# Patient Record
Sex: Female | Born: 1935 | Race: White | Hispanic: No | Smoking: Never smoker
Health system: Southern US, Community
[De-identification: ages and names within clinical notes are randomized; demographics above are authoritative.]

## PROBLEM LIST (undated history)

## (undated) DIAGNOSIS — F419 Anxiety disorder, unspecified: Secondary | ICD-10-CM

## (undated) DIAGNOSIS — E039 Hypothyroidism, unspecified: Secondary | ICD-10-CM

## (undated) DIAGNOSIS — N6019 Diffuse cystic mastopathy of unspecified breast: Secondary | ICD-10-CM

## (undated) DIAGNOSIS — K625 Hemorrhage of anus and rectum: Secondary | ICD-10-CM

## (undated) DIAGNOSIS — C50419 Malignant neoplasm of upper-outer quadrant of unspecified female breast: Secondary | ICD-10-CM

## (undated) DIAGNOSIS — F0390 Unspecified dementia without behavioral disturbance: Secondary | ICD-10-CM

## (undated) DIAGNOSIS — D696 Thrombocytopenia, unspecified: Secondary | ICD-10-CM

## (undated) DIAGNOSIS — I10 Essential (primary) hypertension: Secondary | ICD-10-CM

## (undated) DIAGNOSIS — E079 Disorder of thyroid, unspecified: Secondary | ICD-10-CM

## (undated) DIAGNOSIS — E119 Type 2 diabetes mellitus without complications: Secondary | ICD-10-CM

## (undated) DIAGNOSIS — E785 Hyperlipidemia, unspecified: Secondary | ICD-10-CM

## (undated) DIAGNOSIS — K649 Unspecified hemorrhoids: Secondary | ICD-10-CM

## (undated) HISTORY — DX: Unspecified hemorrhoids: K64.9

## (undated) HISTORY — DX: Essential (primary) hypertension: I10

## (undated) HISTORY — DX: Diffuse cystic mastopathy of unspecified breast: N60.19

## (undated) HISTORY — PX: APPENDECTOMY: SHX54

## (undated) HISTORY — PX: THYROID SURGERY: SHX805

## (undated) HISTORY — DX: Hyperlipidemia, unspecified: E78.5

## (undated) HISTORY — DX: Hemorrhage of anus and rectum: K62.5

## (undated) HISTORY — DX: Anxiety disorder, unspecified: F41.9

## (undated) HISTORY — PX: BREAST SURGERY: SHX581

## (undated) HISTORY — DX: Malignant neoplasm of upper-outer quadrant of unspecified female breast: C50.419

## (undated) HISTORY — DX: Disorder of thyroid, unspecified: E07.9

---

## 2003-08-25 DIAGNOSIS — C50419 Malignant neoplasm of upper-outer quadrant of unspecified female breast: Secondary | ICD-10-CM

## 2003-08-25 HISTORY — DX: Malignant neoplasm of upper-outer quadrant of unspecified female breast: C50.419

## 2003-10-16 ENCOUNTER — Other Ambulatory Visit: Payer: Self-pay

## 2004-06-17 ENCOUNTER — Ambulatory Visit: Payer: Self-pay | Admitting: Radiation Oncology

## 2004-07-18 ENCOUNTER — Ambulatory Visit: Payer: Self-pay | Admitting: Radiation Oncology

## 2004-07-28 ENCOUNTER — Other Ambulatory Visit: Payer: Self-pay

## 2004-07-28 ENCOUNTER — Ambulatory Visit: Payer: Self-pay | Admitting: Ophthalmology

## 2004-08-01 ENCOUNTER — Ambulatory Visit: Payer: Self-pay | Admitting: Ophthalmology

## 2004-08-17 ENCOUNTER — Ambulatory Visit: Payer: Self-pay | Admitting: Radiation Oncology

## 2004-08-21 ENCOUNTER — Ambulatory Visit: Payer: Self-pay | Admitting: General Surgery

## 2004-08-24 ENCOUNTER — Ambulatory Visit: Payer: Self-pay | Admitting: General Surgery

## 2004-10-02 ENCOUNTER — Ambulatory Visit: Payer: Self-pay | Admitting: General Surgery

## 2004-11-01 ENCOUNTER — Ambulatory Visit: Payer: Self-pay | Admitting: Unknown Physician Specialty

## 2004-11-09 ENCOUNTER — Ambulatory Visit: Payer: Self-pay | Admitting: Unknown Physician Specialty

## 2004-11-13 ENCOUNTER — Ambulatory Visit: Payer: Self-pay | Admitting: Internal Medicine

## 2004-11-28 ENCOUNTER — Ambulatory Visit: Payer: Self-pay | Admitting: Internal Medicine

## 2004-12-16 ENCOUNTER — Ambulatory Visit: Payer: Self-pay | Admitting: Internal Medicine

## 2005-02-21 ENCOUNTER — Ambulatory Visit: Payer: Self-pay | Admitting: General Surgery

## 2005-03-26 ENCOUNTER — Ambulatory Visit: Payer: Self-pay | Admitting: Internal Medicine

## 2005-04-17 ENCOUNTER — Ambulatory Visit: Payer: Self-pay | Admitting: Internal Medicine

## 2005-07-23 ENCOUNTER — Ambulatory Visit: Payer: Self-pay | Admitting: Internal Medicine

## 2005-08-17 ENCOUNTER — Ambulatory Visit: Payer: Self-pay | Admitting: Internal Medicine

## 2005-08-22 ENCOUNTER — Ambulatory Visit: Payer: Self-pay | Admitting: General Surgery

## 2006-01-23 ENCOUNTER — Ambulatory Visit: Payer: Self-pay | Admitting: Internal Medicine

## 2006-02-15 ENCOUNTER — Ambulatory Visit: Payer: Self-pay | Admitting: Internal Medicine

## 2006-02-22 ENCOUNTER — Emergency Department: Payer: Self-pay | Admitting: Emergency Medicine

## 2006-03-18 ENCOUNTER — Ambulatory Visit: Payer: Self-pay | Admitting: General Surgery

## 2006-04-02 ENCOUNTER — Ambulatory Visit: Payer: Self-pay | Admitting: General Surgery

## 2006-04-02 ENCOUNTER — Other Ambulatory Visit: Payer: Self-pay

## 2006-04-09 ENCOUNTER — Ambulatory Visit: Payer: Self-pay | Admitting: General Surgery

## 2006-04-09 HISTORY — PX: STAPLE HEMORRHOIDECTOMY: SHX2438

## 2006-04-22 ENCOUNTER — Ambulatory Visit: Payer: Self-pay | Admitting: Internal Medicine

## 2006-07-23 ENCOUNTER — Ambulatory Visit: Payer: Self-pay | Admitting: Internal Medicine

## 2006-08-17 ENCOUNTER — Ambulatory Visit: Payer: Self-pay | Admitting: Internal Medicine

## 2006-09-13 ENCOUNTER — Ambulatory Visit: Payer: Self-pay | Admitting: Family Medicine

## 2006-09-25 ENCOUNTER — Ambulatory Visit: Payer: Self-pay | Admitting: General Surgery

## 2007-01-16 ENCOUNTER — Ambulatory Visit: Payer: Self-pay | Admitting: Internal Medicine

## 2007-01-21 ENCOUNTER — Ambulatory Visit: Payer: Self-pay | Admitting: Internal Medicine

## 2007-02-16 ENCOUNTER — Ambulatory Visit: Payer: Self-pay | Admitting: Internal Medicine

## 2007-03-20 ENCOUNTER — Ambulatory Visit: Payer: Self-pay | Admitting: General Surgery

## 2007-09-18 ENCOUNTER — Ambulatory Visit: Payer: Self-pay | Admitting: Internal Medicine

## 2007-09-24 ENCOUNTER — Ambulatory Visit: Payer: Self-pay | Admitting: Internal Medicine

## 2007-10-19 ENCOUNTER — Ambulatory Visit: Payer: Self-pay | Admitting: Internal Medicine

## 2007-11-13 ENCOUNTER — Emergency Department: Payer: Self-pay | Admitting: Emergency Medicine

## 2007-11-19 ENCOUNTER — Ambulatory Visit: Payer: Self-pay | Admitting: Family Medicine

## 2007-12-17 ENCOUNTER — Ambulatory Visit: Payer: Self-pay | Admitting: Internal Medicine

## 2008-01-16 ENCOUNTER — Ambulatory Visit: Payer: Self-pay | Admitting: Internal Medicine

## 2008-03-17 ENCOUNTER — Ambulatory Visit: Payer: Self-pay | Admitting: Internal Medicine

## 2008-03-17 ENCOUNTER — Ambulatory Visit: Payer: Self-pay | Admitting: General Surgery

## 2008-05-18 ENCOUNTER — Ambulatory Visit: Payer: Self-pay | Admitting: Internal Medicine

## 2008-05-21 ENCOUNTER — Ambulatory Visit: Payer: Self-pay | Admitting: Internal Medicine

## 2008-05-31 ENCOUNTER — Ambulatory Visit: Payer: Self-pay | Admitting: Family Medicine

## 2008-06-17 ENCOUNTER — Ambulatory Visit: Payer: Self-pay | Admitting: Internal Medicine

## 2008-11-15 ENCOUNTER — Ambulatory Visit: Payer: Self-pay | Admitting: Internal Medicine

## 2008-11-17 ENCOUNTER — Ambulatory Visit: Payer: Self-pay | Admitting: Internal Medicine

## 2008-12-16 ENCOUNTER — Ambulatory Visit: Payer: Self-pay | Admitting: Internal Medicine

## 2009-03-23 ENCOUNTER — Ambulatory Visit: Payer: Self-pay | Admitting: General Surgery

## 2009-08-17 ENCOUNTER — Ambulatory Visit: Payer: Self-pay | Admitting: Internal Medicine

## 2009-09-17 ENCOUNTER — Ambulatory Visit: Payer: Self-pay | Admitting: Internal Medicine

## 2009-10-18 ENCOUNTER — Ambulatory Visit: Payer: Self-pay | Admitting: Family Medicine

## 2009-12-05 ENCOUNTER — Ambulatory Visit: Payer: Self-pay | Admitting: Family Medicine

## 2010-03-31 ENCOUNTER — Ambulatory Visit: Payer: Self-pay | Admitting: Family Medicine

## 2010-04-25 ENCOUNTER — Ambulatory Visit: Payer: Self-pay | Admitting: Family Medicine

## 2010-09-01 ENCOUNTER — Ambulatory Visit: Payer: Self-pay | Admitting: Internal Medicine

## 2010-09-17 ENCOUNTER — Ambulatory Visit: Payer: Self-pay | Admitting: Internal Medicine

## 2011-01-11 ENCOUNTER — Ambulatory Visit: Payer: Self-pay | Admitting: Family Medicine

## 2011-02-21 ENCOUNTER — Ambulatory Visit: Payer: Self-pay

## 2011-04-04 ENCOUNTER — Ambulatory Visit: Payer: Self-pay | Admitting: Internal Medicine

## 2011-05-23 ENCOUNTER — Ambulatory Visit: Payer: Self-pay | Admitting: Family Medicine

## 2011-09-21 ENCOUNTER — Ambulatory Visit: Payer: Self-pay | Admitting: Internal Medicine

## 2011-09-21 LAB — CREATININE, SERUM
Creatinine: 0.79 mg/dL (ref 0.60–1.30)
EGFR (African American): >60
EGFR (Non-African Amer.): >60

## 2011-09-21 LAB — CBC CANCER CENTER
Basophil #: 0 x10 3/mm (ref 0.0–0.1)
Basophil %: 0.6 %
HCT: 44.4 % (ref 35.0–47.0)
HGB: 15.1 g/dL (ref 12.0–16.0)
Lymphocyte %: 41.2 %
Monocyte %: 9.2 %
Neutrophil #: 2.1 x10 3/mm (ref 1.4–6.5)
Neutrophil %: 43.3 %
Platelet: 143 x10 3/mm — ABNORMAL LOW (ref 150–440)
RBC: 4.6 10*6/uL (ref 3.80–5.20)
RDW: 14.2 % (ref 11.5–14.5)
WBC: 4.8 x10 3/mm (ref 3.6–11.0)

## 2011-09-21 LAB — HEPATIC FUNCTION PANEL A (ARMC)
Albumin: 3.7 g/dL (ref 3.4–5.0)
Alkaline Phosphatase: 69 U/L (ref 50–136)
Bilirubin,Total: 0.8 mg/dL (ref 0.2–1.0)
SGOT(AST): 18 U/L (ref 15–37)
Total Protein: 7 g/dL (ref 6.4–8.2)

## 2011-10-19 ENCOUNTER — Ambulatory Visit: Payer: Self-pay | Admitting: Internal Medicine

## 2011-12-03 ENCOUNTER — Ambulatory Visit: Payer: Self-pay | Admitting: Obstetrics and Gynecology

## 2012-04-22 ENCOUNTER — Ambulatory Visit: Payer: Self-pay | Admitting: Family Medicine

## 2013-04-23 ENCOUNTER — Ambulatory Visit: Payer: Self-pay | Admitting: Family Medicine

## 2014-04-17 HISTORY — PX: HEMORRHOID BANDING: SHX5850

## 2014-04-28 ENCOUNTER — Ambulatory Visit: Payer: Self-pay | Admitting: Family Medicine

## 2014-04-28 ENCOUNTER — Encounter: Payer: Self-pay | Admitting: General Surgery

## 2014-05-07 ENCOUNTER — Encounter: Payer: Self-pay | Admitting: General Surgery

## 2014-05-10 ENCOUNTER — Ambulatory Visit (INDEPENDENT_AMBULATORY_CARE_PROVIDER_SITE_OTHER): Payer: 59 | Admitting: General Surgery

## 2014-05-10 ENCOUNTER — Encounter: Payer: Self-pay | Admitting: General Surgery

## 2014-05-10 VITALS — BP 124/68 | HR 80 | Resp 14 | Wt 153.0 lb

## 2014-05-10 DIAGNOSIS — K648 Other hemorrhoids: Secondary | ICD-10-CM

## 2014-05-10 NOTE — Progress Notes (Signed)
Patient ID: Alyssa Holloway, female   DOB: 03/21/1921, 80 y.o.   MRN: [id number]  Chief Complaint  Patient presents with  . Other    evaluation of hemorrhoids    HPI Alyssa Holloway is a 80 y.o. female who presents for an evaluation of hemorrhoids. She states she has had problems for awhile but within the last month the problem has gotten worse. The patient states she has rectal bleeding that she notices when wiping as well in the towel bowl at times. She was seen at [facility] approximately 1 month ago after an episode of a large volume of rectal bleeding, and was prescribed rectal cream that has helped a good deal. She is still C. Intermittent bright blood with bowel movements. No more episodes of large volume bleed. She denies any pain. The blood is described as bright red blood. No pain with bowel movements.   HPI  Past Medical History  Diagnosis Date  . Hemorrhoid   . Rectal bleeding   . Thyroid disease   . Anxiety   . Hyperlipidemia   . Fibrocystic breast disease   . Hypertension   . Malignant neoplasm of upper-outer quadrant of female breast August 25, 2003    Invasive ductal carcinoma, T2, N0.   ER/PR negative, HER-2/neu 3+.    Past Surgical History  Procedure Laterality Date  . Thyroid surgery    . Breast surgery Right     lumpectomy  . Appendectomy    . Staple hemorrhoidectomy  April 09, 2006    History reviewed. No pertinent family history.  Social History History  Substance Use Topics  . Smoking status: Never Smoker   . Smokeless tobacco: Not on file  . Alcohol Use: Yes    No Known Allergies  Current Outpatient Prescriptions  Medication Sig Dispense Refill  . amLODipine-benazepril (LOTREL) 5-20 MG per capsule Take 1 capsule by mouth daily.      Marland Kitchen ascorbic acid (VITAMIN C) 1000 MG tablet Take 1,000 mg by mouth daily.      Marland Kitchen aspirin 81 MG tablet Take 81 mg by mouth daily.      . cholecalciferol (VITAMIN D) 1000 UNITS tablet Take 1,000 Units by mouth  daily.      . furosemide (LASIX) 20 MG tablet Take 1 tablet by mouth daily as needed.      Marland Kitchen levothyroxine (SYNTHROID, LEVOTHROID) 125 MCG tablet Take 1 tablet by mouth daily.      Marland Kitchen lovastatin (MEVACOR) 20 MG tablet Take 1 tablet by mouth daily.      . metoprolol (LOPRESSOR) 100 MG tablet Take 1 tablet by mouth daily.      . Multiple Vitamin (MULTIVITAMIN) tablet Take 1 tablet by mouth daily.      . Omega-3 Fatty Acids (FISH OIL PO) Take by mouth.      Marland Kitchen PROCTOSOL HC 2.5 % rectal cream Apply 1 application topically 3 (three) times daily as needed.       No current facility-administered medications for this visit.    Review of Systems Review of Systems  Constitutional: Negative.   Respiratory: Negative.   Cardiovascular: Negative.   Gastrointestinal: Positive for anal bleeding.    Blood pressure 124/68, pulse 80, resp. rate 14, weight 153 lb (69.4 kg).  Physical Exam Physical Exam  Constitutional: She is oriented to person, place, and time. She appears well-developed and well-nourished.  Cardiovascular: Normal rate and regular rhythm.   Murmur heard.  Systolic murmur is present with a grade  of 1/6  Pulmonary/Chest: Effort normal and breath sounds normal.  Abdominal: Soft. Normal appearance and bowel sounds are normal. There is no hepatosplenomegaly. There is no tenderness. A hernia (umbilical hernia present) is present.  Genitourinary:  Anoscopy showed a prolapsing internal hemorrhoid at the 2 o'clock position (dorsal lithotomy). This was a sensate when grasped with a hemostat.a small area of clot was evident on the tip. Normal sphincter tone appreciated. The previous PPH staple line is palpable. No rectal masses noted. Stool obtained during anoscopy was Hemoccult negative.  Neurological: She is alert and oriented to person, place, and time.  Skin: Skin is warm and dry.    Data Reviewed Laboratory studies completed on April 13, 2014 at the time of her walk in clinic evaluation  showed a hemoglobin of 15.3 with an MCV of 97. Platelet count 124,000. White blood cell count 5000 with normal differential.  Assessment    Internal hemorrhoid with bleeding.     Plan    The patient was amenable to rubber band ligation. A double rubber band was placed making use of the Baron's device without difficulty. After 10 minutes of observation the patient was pain-free and was discharged home.  Arrangements are in place for a follow up examination in 2 weeks. She was encouraged to call promptly should she develop rectal pain.    PCP: Tera Partridge 05/11/2014, 1:03 PM

## 2014-05-10 NOTE — Patient Instructions (Signed)
Patient to return in 2 weeks for follow up. The patient is aware to call back for any questions or concerns.  

## 2014-05-11 ENCOUNTER — Encounter: Payer: Self-pay | Admitting: General Surgery

## 2014-05-11 DIAGNOSIS — K648 Other hemorrhoids: Secondary | ICD-10-CM | POA: Insufficient documentation

## 2014-05-21 ENCOUNTER — Telehealth: Payer: Self-pay | Admitting: General Surgery

## 2014-05-21 NOTE — Telephone Encounter (Signed)
PATIENT CALLED TO SAY SHE WAS STILL HAVING BLEEDING & STILL VERY TENDER AFTER HAVING HEMORRHOID BANDING 05-10-14.HER NEXT APPT IS 05-26-14. WANTED TO KNOW IF THIS WAS NORMAL &  WHAT CAN SHE DO TO HELP?

## 2014-05-21 NOTE — Telephone Encounter (Signed)
Contacted patient. Still with tender nodule at site of hemorrhoid banding.  Will use Tucks pads, call over the weekend if increasing discomfort. F/U otherwise as scheduled on Sept 9.

## 2014-05-26 ENCOUNTER — Encounter: Payer: Self-pay | Admitting: General Surgery

## 2014-05-26 ENCOUNTER — Ambulatory Visit (INDEPENDENT_AMBULATORY_CARE_PROVIDER_SITE_OTHER): Payer: 59 | Admitting: General Surgery

## 2014-05-26 VITALS — BP 160/84 | HR 60 | Resp 14 | Ht 63.0 in | Wt 153.0 lb

## 2014-05-26 DIAGNOSIS — K648 Other hemorrhoids: Secondary | ICD-10-CM

## 2014-05-26 MED ORDER — LIDOCAINE 5 % EX OINT: 1.0000 "application " | TOPICAL_OINTMENT | Freq: Three times a day (TID) | CUTANEOUS | Status: AC | PRN

## 2014-05-26 NOTE — Patient Instructions (Addendum)
Patient advised she can take Metamucil daily with help of bowels. Patient advised to start topical ointment for relief of tenderness. Patient to return in 1 month for follow up. The patient is aware to call back for any questions or concerns.

## 2014-05-26 NOTE — Progress Notes (Addendum)
Patient ID: Alyssa Holloway, female   DOB: [birth year], 80 y.o.   MRN: [id number]  Chief Complaint  Patient presents with  . Follow-up    2 week follow up hemorrhoids    HPI Alyssa Holloway is a 80 y.o. female who presents for a 2 week follow up of hemorrhoid banding. The procedure was performed on 05/11/14. She complains of tenderness as well as bleeding. She states the bleeding has decreased but the tenderness has stayed consistent.   HPI  Past Medical History  Diagnosis Date  . Hemorrhoid   . Rectal bleeding   . Thyroid disease   . Anxiety   . Hyperlipidemia   . Fibrocystic breast disease   . Hypertension   . Malignant neoplasm of upper-outer quadrant of female breast August 25, 2003    Invasive ductal carcinoma, T2, N0.   ER/PR negative, HER-2/neu 3+.    Past Surgical History  Procedure Laterality Date  . Thyroid surgery    . Breast surgery Right     lumpectomy  . Appendectomy    . Staple hemorrhoidectomy  April 09, 2006    No family history on file.  Social History History  Substance Use Topics  . Smoking status: Never Smoker   . Smokeless tobacco: Not on file  . Alcohol Use: Yes    No Known Allergies  Current Outpatient Prescriptions  Medication Sig Dispense Refill  . amLODipine-benazepril (LOTREL) 5-20 MG per capsule Take 1 capsule by mouth daily.      Marland Kitchen amLODipine-benazepril (LOTREL) 5-20 MG per capsule Take by mouth.      Marland Kitchen ascorbic acid (VITAMIN C) 1000 MG tablet Take 1,000 mg by mouth daily.      Marland Kitchen aspirin 81 MG tablet Take 81 mg by mouth daily.      . cholecalciferol (VITAMIN D) 1000 UNITS tablet Take 1,000 Units by mouth daily.      . furosemide (LASIX) 20 MG tablet Take 1 tablet by mouth daily as needed.      Marland Kitchen levothyroxine (SYNTHROID, LEVOTHROID) 125 MCG tablet Take 1 tablet by mouth daily.      Marland Kitchen lovastatin (MEVACOR) 20 MG tablet Take 1 tablet by mouth daily.      . metoprolol (LOPRESSOR) 100 MG tablet Take 1 tablet by mouth daily.      .  Multiple Vitamin (MULTIVITAMIN) tablet Take 1 tablet by mouth daily.      . Omega-3 Fatty Acids (FISH OIL PO) Take by mouth.      Marland Kitchen PROCTOSOL HC 2.5 % rectal cream Apply 1 application topically 3 (three) times daily as needed.      . lidocaine (XYLOCAINE) 5 % ointment Apply 1 application topically 3 (three) times daily as needed.  35.44 g  1   No current facility-administered medications for this visit.    Review of Systems Review of Systems  Constitutional: Negative.   Respiratory: Negative.   Cardiovascular: Negative.   Gastrointestinal: Positive for anal bleeding and rectal pain.    Blood pressure 160/84, pulse 60, resp. rate 14, height $RemoveBe'5\' 3"'JagfcnZCR$  (1.6 m), weight 153 lb (69.4 kg).  Physical Exam Physical Exam  Constitutional: She is oriented to person, place, and time. She appears well-developed and well-nourished.  Neurological: She is alert and oriented to person, place, and time.  Skin: Skin is warm and dry.   5 cc of 2% lidocaine jelly was placed in the anal canal prior to digital exam. Examination shows the area previously banded has sloughed with  a small residual area of granulation tissue. No other palpable masses. No adjacent thickening or evidence to suggest intramural abscess.    Assessment    Slow resolution of discomfort status post hemorrhoid banding.    Plan    I anticipate the patient will be asymptomatic within the next week or 2. A prescription for his Xylocaine ointment was provided for use.  Daily stool fiber supplement such as Metamucil had been encouraged.  Will plan on a followup examination in one month.    PCP/Ref MD: Alyssa Holloway 05/28/2014, 1:46 PM

## 2014-06-28 ENCOUNTER — Ambulatory Visit (INDEPENDENT_AMBULATORY_CARE_PROVIDER_SITE_OTHER): Payer: 59 | Admitting: General Surgery

## 2014-06-28 ENCOUNTER — Encounter: Payer: Self-pay | Admitting: General Surgery

## 2014-06-28 VITALS — BP 130/72 | HR 64 | Resp 14 | Ht 63.0 in | Wt 153.0 lb

## 2014-06-28 DIAGNOSIS — K648 Other hemorrhoids: Secondary | ICD-10-CM

## 2014-06-28 LAB — HEMOCCULT GUIAC POC 1CARD (OFFICE): FECAL OCCULT BLD: NEGATIVE

## 2014-06-28 NOTE — Patient Instructions (Signed)
Patient to continue metamucil on a daily basis. The patient is aware to call back for any questions or concerns. Patient to return as needed.

## 2014-06-28 NOTE — Progress Notes (Signed)
Patient ID: Alyssa Holloway, female   DOB: 1921-05-12, 80 y.o.   MRN: [id number]  Chief Complaint  Patient presents with  . Follow-up    hemorrhoids    HPI Alyssa Holloway is a 80 y.o. female who presents for a follow up evaluation of hemorrhoids. The states she is doing well. No complaints at this time. The bleeding has stopped. No pain and no problems with bowels.   HPI  Past Medical History  Diagnosis Date  . Hemorrhoid   . Rectal bleeding   . Thyroid disease   . Anxiety   . Hyperlipidemia   . Fibrocystic breast disease   . Hypertension   . Malignant neoplasm of upper-outer quadrant of female breast August 25, 2003    Invasive ductal carcinoma, T2, N0.   ER/PR negative, HER-2/neu 3+.    Past Surgical History  Procedure Laterality Date  . Thyroid surgery    . Breast surgery Right     lumpectomy  . Appendectomy    . Staple hemorrhoidectomy  April 09, 2006  . Hemorrhoid banding  August 2015    No family history on file.  Social History History  Substance Use Topics  . Smoking status: Never Smoker   . Smokeless tobacco: Not on file  . Alcohol Use: Yes    No Known Allergies  Current Outpatient Prescriptions  Medication Sig Dispense Refill  . amLODipine-benazepril (LOTREL) 5-20 MG per capsule Take 1 capsule by mouth daily.      Marland Kitchen amLODipine-benazepril (LOTREL) 5-20 MG per capsule Take by mouth.      Marland Kitchen ascorbic acid (VITAMIN C) 1000 MG tablet Take 1,000 mg by mouth daily.      Marland Kitchen aspirin 81 MG tablet Take 81 mg by mouth daily.      . cholecalciferol (VITAMIN D) 1000 UNITS tablet Take 1,000 Units by mouth daily.      . furosemide (LASIX) 20 MG tablet Take 1 tablet by mouth daily as needed.      Marland Kitchen levothyroxine (SYNTHROID, LEVOTHROID) 125 MCG tablet Take 1 tablet by mouth daily.      Marland Kitchen lidocaine (XYLOCAINE) 5 % ointment Apply 1 application topically 3 (three) times daily as needed.  35.44 g  1  . lovastatin (MEVACOR) 20 MG tablet Take 1 tablet by mouth daily.      .  metoprolol (LOPRESSOR) 100 MG tablet Take 1 tablet by mouth daily.      . Multiple Vitamin (MULTIVITAMIN) tablet Take 1 tablet by mouth daily.      . Omega-3 Fatty Acids (FISH OIL PO) Take by mouth.       No current facility-administered medications for this visit.    Review of Systems Review of Systems  Constitutional: Negative.   Respiratory: Negative.   Cardiovascular: Negative.   Gastrointestinal: Negative.     Blood pressure 130/72, pulse 64, resp. rate 14, height 5\' 3"  (1.6 m), weight 153 lb (69.4 kg).  Physical Exam Physical Exam  Constitutional: She is oriented to person, place, and time. She appears well-developed and well-nourished.  Genitourinary: Rectal exam shows no external hemorrhoid, no internal hemorrhoid, no mass, no tenderness and anal tone normal.  Neurological: She is alert and oriented to person, place, and time.  Skin: Skin is warm and dry.      Assessment    Doing well status post hemorrhoid banding.     Plan    Plans be for the patient to make use of a daily fiber supplement. Follow up  here will be on an as-needed basis in regards to her hemorrhoids.      PCP/Ref. MD Netty Starring   Robert Bellow 06/29/2014, 8:35 PM

## 2014-06-29 ENCOUNTER — Encounter: Payer: Self-pay | Admitting: General Surgery

## 2014-07-14 ENCOUNTER — Ambulatory Visit: Payer: 59 | Admitting: General Surgery

## 2014-07-19 ENCOUNTER — Encounter: Payer: Self-pay | Admitting: General Surgery

## 2015-06-01 ENCOUNTER — Encounter: Payer: Self-pay | Admitting: Occupational Medicine

## 2015-06-01 ENCOUNTER — Emergency Department
Admission: EM | Admit: 2015-06-01 | Discharge: 2015-06-01 | Disposition: A | Discharge status: Home / Self Care | Payer: Medicare Other | Attending: Emergency Medicine | Admitting: Emergency Medicine

## 2015-06-01 DIAGNOSIS — I10 Essential (primary) hypertension: Secondary | ICD-10-CM | POA: Insufficient documentation

## 2015-06-01 DIAGNOSIS — Z79899 Other long term (current) drug therapy: Secondary | ICD-10-CM | POA: Diagnosis not present

## 2015-06-01 DIAGNOSIS — R55 Syncope and collapse: Secondary | ICD-10-CM | POA: Diagnosis not present

## 2015-06-01 DIAGNOSIS — R251 Tremor, unspecified: Secondary | ICD-10-CM | POA: Insufficient documentation

## 2015-06-01 DIAGNOSIS — Z7982 Long term (current) use of aspirin: Secondary | ICD-10-CM | POA: Diagnosis not present

## 2015-06-01 LAB — URINALYSIS COMPLETE WITH MICROSCOPIC (ARMC ONLY)
BILIRUBIN URINE: NEGATIVE
Bacteria, UA: NONE SEEN
Glucose, UA: NEGATIVE mg/dL
HGB URINE DIPSTICK: NEGATIVE
KETONES UR: NEGATIVE mg/dL
LEUKOCYTES UA: NEGATIVE
NITRITE: NEGATIVE
PH: 8 (ref 5.0–8.0)
PROTEIN: NEGATIVE mg/dL
SPECIFIC GRAVITY, URINE: 1.005 (ref 1.005–1.030)

## 2015-06-01 LAB — CBC
HCT: 46.5 % (ref 35.0–47.0)
Hemoglobin: 15.5 g/dL (ref 12.0–16.0)
MCH: 32.6 pg (ref 26.0–34.0)
MCHC: 33.4 g/dL (ref 32.0–36.0)
MCV: 97.7 fL (ref 80.0–100.0)
PLATELETS: 108 10*3/uL — AB (ref 150–440)
RBC: 4.76 MIL/uL (ref 3.80–5.20)
RDW: 14.1 % (ref 11.5–14.5)
WBC: 4.6 10*3/uL (ref 3.6–11.0)

## 2015-06-01 LAB — BASIC METABOLIC PANEL
Anion gap: 8 (ref 5–15)
BUN: 16 mg/dL (ref 6–20)
CHLORIDE: 107 mmol/L (ref 101–111)
CO2: 26 mmol/L (ref 22–32)
CREATININE: 0.6 mg/dL (ref 0.44–1.00)
Calcium: 8.8 mg/dL — ABNORMAL LOW (ref 8.9–10.3)
Glucose, Bld: 100 mg/dL — ABNORMAL HIGH (ref 65–99)
POTASSIUM: 3.5 mmol/L (ref 3.5–5.1)
SODIUM: 141 mmol/L (ref 135–145)

## 2015-06-01 LAB — TROPONIN I: Troponin I: <0.03 ng/mL (ref <0.031)

## 2015-06-01 LAB — GLUCOSE, CAPILLARY: Glucose-Capillary: 100 mg/dL — ABNORMAL HIGH (ref 65–99)

## 2015-06-01 NOTE — Discharge Instructions (Signed)
You were evaluated for hand tremors this morning, which are now gone. Your exam and evaluation in the emergency department are reassuring. Return to the emergency department for any new or worsening condition including any fever, chest pain, trouble breathing, nausea, weakness, numbness, headache, confusion or altered mental status, or any other symptoms concerning to you.   Tremor Tremor is a rhythmic, involuntary muscular contraction characterized by oscillations (to-and-fro movements) of a part of the body. The most common of all involuntary movements, tremor can affect various body parts such as the hands, head, facial structures, vocal cords, trunk, and legs; most tremors, however, occur in the hands. Tremor often accompanies neurological disorders associated with aging. Although the disorder is not life-threatening, it can be responsible for functional disability and social embarrassment. TREATMENT  There are many types of tremor and several ways in which tremor is classified. The most common classification is by behavioral context or position. There are five categories of tremor within this classification: resting, postural, kinetic, task-specific, and psychogenic. Resting or static tremor occurs when the muscle is at rest, for example when the hands are lying on the lap. This type of tremor is often seen in patients with Parkinson's disease. Postural tremor occurs when a patient attempts to maintain posture, such as holding the hands outstretched. Postural tremors include physiological tremor, essential tremor, tremor with basal ganglia disease (also seen in patients with Parkinson's disease), cerebellar postural tremor, tremor with peripheral neuropathy, post-traumatic tremor, and alcoholic tremor. Kinetic or intention (action) tremor occurs during purposeful movement, for example during finger-to-nose testing. Task-specific tremor appears when performing goal-oriented tasks such as handwriting,  speaking, or standing. This group consists of primary writing tremor, vocal tremor, and orthostatic tremor. Psychogenic tremor occurs in both older and younger patients. The key feature of this tremor is that it dramatically lessens or disappears when the patient is distracted. PROGNOSIS There are some treatment options available for tremor; the appropriate treatment depends on accurate diagnosis of the cause. Some tremors respond to treatment of the underlying condition, for example in some cases of psychogenic tremor treating the patient's underlying mental problem may cause the tremor to disappear. Also, patients with tremor due to Parkinson's disease may be treated with Levodopa drug therapy. Symptomatic drug therapy is available for several other tremors as well. For those cases of tremor in which there is no effective drug treatment, physical measures such as teaching the patient to brace the affected limb during the tremor are sometimes useful. Surgical intervention such as thalamotomy or deep brain stimulation may be useful in certain cases. Document Released: 08/24/2002 Document Revised: 11/26/2011 Document Reviewed: 09/03/2005 [facility] Patient Information 2015 [place], [place]. This information is not intended to replace advice given to you by your health care provider. Make sure you discuss any questions you have with your health care provider.

## 2015-06-01 NOTE — ED Notes (Signed)
Pt presents via ems woke up at 530am shaking fell like she was going to pass out but didnt. EMS reported BS 105 bp 146/100 hx of HTN.

## 2015-06-01 NOTE — ED Provider Notes (Signed)
[facility] Emergency Department Provider Note   ____________________________________________  Time seen: 7:15 AM I have reviewed the triage vital signs and the triage nursing note.  HISTORY  Chief Complaint Near Syncope   Historian Patient  HPI Alyssa Holloway is a 81 y.o. female who arrived EMS from home, where she lives alone, with a complaint of both hands shaking. Patient states she woke up around 4 AM to urinate, and both of her hands and arms were shaking with tremors. This is not happening her before. She went back to sleep and then woke up again and her hands were still shaking when they were outstretched and she became anxious or nervous over this. No headache, no confusion, no slurred speech, no weakness, no numbness, no fever, no recent illness. No chest pain, and no trouble breathing. Patient denies palpitations or syncope.    Past Medical History  Diagnosis Date  . Hemorrhoid   . Rectal bleeding   . Thyroid disease   . Anxiety   . Hyperlipidemia   . Fibrocystic breast disease   . Hypertension   . Malignant neoplasm of upper-outer quadrant of female breast August 25, 2003    Invasive ductal carcinoma, T2, N0.   ER/PR negative, HER-2/neu 3+.    Patient Active Problem List   Diagnosis Date Noted  . Internal hemorrhoid 05/11/2014    Past Surgical History  Procedure Laterality Date  . Thyroid surgery    . Breast surgery Right     lumpectomy  . Appendectomy    . Staple hemorrhoidectomy  April 09, 2006  . Hemorrhoid banding  August 2015    Current Outpatient Rx  Name  Route  Sig  Dispense  Refill  . amLODipine (NORVASC) 5 MG tablet   Oral   Take 5 mg by mouth daily.         Marland Kitchen ascorbic acid (VITAMIN C) 1000 MG tablet   Oral   Take 1,000 mg by mouth daily.         Marland Kitchen aspirin 81 MG tablet   Oral   Take 81 mg by mouth daily.         . benazepril (LOTENSIN) 20 MG tablet   Oral   Take 20 mg by mouth daily.         .  cholecalciferol (VITAMIN D) 1000 UNITS tablet   Oral   Take 1,000 Units by mouth daily.         . furosemide (LASIX) 20 MG tablet   Oral   Take 1 tablet by mouth daily as needed.         Marland Kitchen levothyroxine (SYNTHROID, LEVOTHROID) 125 MCG tablet   Oral   Take 1 tablet by mouth daily.         Marland Kitchen lovastatin (MEVACOR) 20 MG tablet   Oral   Take 1 tablet by mouth daily.         . metoprolol (LOPRESSOR) 100 MG tablet   Oral   Take 1 tablet by mouth daily.         . Multiple Vitamin (MULTIVITAMIN) tablet   Oral   Take 1 tablet by mouth daily.         . Omega-3 Fatty Acids (FISH OIL PO)   Oral   Take by mouth.         . lidocaine (XYLOCAINE) 5 % ointment   Topical   Apply 1 application topically 3 (three) times daily as needed.   35.44 g  1     Allergies Review of patient's allergies indicates no known allergies.  History reviewed. No pertinent family history.  Social History Social History  Substance Use Topics  . Smoking status: Never Smoker   . Smokeless tobacco: None  . Alcohol Use: Yes   patient works as a Psychologist, occupational here at the hospital  Review of Systems  Constitutional: Negative for fever. Eyes: Negative for visual changes. ENT: Negative for sore throat. Cardiovascular: Negative for chest pain. Respiratory: Negative for shortness of breath. Gastrointestinal: Negative for abdominal pain, vomiting and diarrhea. Genitourinary: Negative for dysuria. Musculoskeletal: Negative for back pain. Skin: Negative for rash. Neurological: Negative for headache. 10 point Review of Systems otherwise negative ____________________________________________   PHYSICAL EXAM:  VITAL SIGNS: ED Triage Vitals  Enc Vitals Group     BP 06/01/15 0644 155/97 mmHg     Pulse Rate 06/01/15 0644 75     Resp 06/01/15 0644 18     Temp 06/01/15 0644 97.9 F (36.6 C)     Temp Source 06/01/15 0644 Oral     SpO2 06/01/15 0644 100 %     Weight 06/01/15 0644 153 lb (69.4  kg)     Height 06/01/15 0644 _0  (1.702 m)     Head Cir --      Peak Flow --      Pain Score 06/01/15 0646 0     Pain Loc --      Pain Edu? --      Excl. in [place]? --      Constitutional: Alert and oriented. Well appearing and in no distress. Eyes: Conjunctivae are normal. PERRL. Normal extraocular movements. ENT   Head: Normocephalic and atraumatic.   Nose: No congestion/rhinnorhea.   Mouth/Throat: Mucous membranes are moist.   Neck: No stridor. Cardiovascular/Chest: Normal rate, regular rhythm.  No murmurs, rubs, or gallops. Respiratory: Normal respiratory effort without tachypnea nor retractions. Breath sounds are clear and equal bilaterally. No wheezes/rales/rhonchi. Gastrointestinal: Soft. No distention, no guarding, no rebound. Nontender   Genitourinary/rectal:Deferred Musculoskeletal: Nontender with normal range of motion in all extremities. No joint effusions.  No lower extremity tenderness.  Trace lower extremity edema bilaterally.  Neurologic:  Normal speech and language. No gross or focal neurologic deficits are appreciated. 5 out of 5 strength in 4 extremities. Coordination intact. Cranial nerves II through X intact. No sensory deficits. Skin:  Skin is warm, dry and intact. No rash noted. Psychiatric: Mood and affect are normal. Speech and behavior are normal. Patient exhibits appropriate insight and judgment.  ____________________________________________   EKG I, Lisa Roca, MD, the attending physician have personally viewed and interpreted all ECGs.  80 bpm. Normal sinus rhythm with premature supraventricular complexes, right bundle branch block. Normal axis. T-wave inverted inferiorly as well as nonspecific T-wave anterolaterally.  This EKG is similar to previous EKG reviewed in the system. ____________________________________________  LABS (pertinent positives/negatives)  Basic metabolic panel without significant abnormality CBC within normal  limits Troponin less than 0.03 Urinalysis: Negative for ketones, leukocytes, red blood cells, white blood cells, and bacteria ____________________________________________  RADIOLOGY All Xrays were viewed by me. Imaging interpreted by Radiologist.  None __________________________________________  PROCEDURES  Procedure(s) performed: None  Critical Care performed: None  ____________________________________________   ED COURSE / ASSESSMENT AND PLAN  CONSULTATIONS: None  Pertinent labs & imaging results that were available during my care of the patient were reviewed by me and considered in my medical decision making (see chart for details).   This patient is  a well-appearing 81 year old who has been in relatively good health, who presented because she woke up this morning with both of her hands having a tremor. It seems like it was somewhat worse when she went to grab something. The tremor is now gone. She has no neurologic deficits. She does not describe any neurologic deficit by history. I don't think this episode sounds like it was a TIA or stroke. There is no cardiopulmonary symptoms, and her laboratory and EKG evaluation as well as physical examination are reassuring and I do not suspect an arrhythmia, or acute cardiac syndrome.  Patient's physical exam is normal now without any tremor at all. She's not giving any additional symptoms of infectious etiologies. Her white blood cell count is normal. Urinalysis negative.  I discussed with the patient that I am unsure what the underlying source of the tremulousness was this morning, however her exam and evaluation are reassuring. And I'm going to go ahead and discharge her home. She can follow-up with a primary care physician.    Patient / Family / Caregiver informed of clinical course, medical decision-making process, and agree with plan.   I discussed return precautions, follow-up instructions, and discharged instructions with  patient and/or family.  ___________________________________________   FINAL CLINICAL IMPRESSION(S) / ED DIAGNOSES   Final diagnoses:  Tremulousness       Lisa Roca, MD 06/01/15 [phone]

## 2017-03-05 ENCOUNTER — Encounter: Payer: Self-pay | Admitting: *Deleted

## 2017-03-21 ENCOUNTER — Ambulatory Visit: Payer: Medicare Other | Admitting: Anesthesiology

## 2017-03-21 ENCOUNTER — Encounter
Admission: RE | Disposition: A | Discharge status: Home / Self Care | Payer: Self-pay | Source: Ambulatory Visit | Attending: Ophthalmology

## 2017-03-21 ENCOUNTER — Encounter: Payer: Self-pay | Admitting: Anesthesiology

## 2017-03-21 ENCOUNTER — Ambulatory Visit
Admission: RE | Admit: 2017-03-21 | Discharge: 2017-03-21 | Disposition: A | Discharge status: Home / Self Care | Payer: Medicare Other | Source: Ambulatory Visit | Attending: Ophthalmology | Admitting: Ophthalmology

## 2017-03-21 DIAGNOSIS — Z79899 Other long term (current) drug therapy: Secondary | ICD-10-CM | POA: Diagnosis not present

## 2017-03-21 DIAGNOSIS — Z885 Allergy status to narcotic agent status: Secondary | ICD-10-CM | POA: Diagnosis not present

## 2017-03-21 DIAGNOSIS — F419 Anxiety disorder, unspecified: Secondary | ICD-10-CM | POA: Insufficient documentation

## 2017-03-21 DIAGNOSIS — Z7982 Long term (current) use of aspirin: Secondary | ICD-10-CM | POA: Diagnosis not present

## 2017-03-21 DIAGNOSIS — E7800 Pure hypercholesterolemia, unspecified: Secondary | ICD-10-CM | POA: Diagnosis not present

## 2017-03-21 DIAGNOSIS — I10 Essential (primary) hypertension: Secondary | ICD-10-CM | POA: Diagnosis not present

## 2017-03-21 DIAGNOSIS — Z853 Personal history of malignant neoplasm of breast: Secondary | ICD-10-CM | POA: Diagnosis not present

## 2017-03-21 DIAGNOSIS — H2512 Age-related nuclear cataract, left eye: Secondary | ICD-10-CM | POA: Diagnosis not present

## 2017-03-21 DIAGNOSIS — E039 Hypothyroidism, unspecified: Secondary | ICD-10-CM | POA: Diagnosis not present

## 2017-03-21 HISTORY — DX: Hypothyroidism, unspecified: E03.9

## 2017-03-21 HISTORY — PX: CATARACT EXTRACTION W/PHACO: SHX586

## 2017-03-21 SURGERY — PHACOEMULSIFICATION, CATARACT, WITH IOL INSERTION
Anesthesia: Monitor Anesthesia Care | Site: Eye | Laterality: Left | Wound class: Clean

## 2017-03-21 MED ORDER — ARMC OPHTHALMIC DILATING DROPS
1.0000 "application " | OPHTHALMIC | Status: AC
  Administered 2017-03-21 (×3): 1 via OPHTHALMIC

## 2017-03-21 MED ORDER — EPINEPHRINE PF 1 MG/ML IJ SOLN
INTRAOCULAR | Status: DC | PRN
  Administered 2017-03-21: 10:00:00 via OPHTHALMIC

## 2017-03-21 MED ORDER — CARBACHOL 0.01 % IO SOLN
INTRAOCULAR | Status: DC | PRN
  Administered 2017-03-21: 0.5 mL via INTRAOCULAR

## 2017-03-21 MED ORDER — NEOMYCIN-POLYMYXIN-DEXAMETH 3.5-10000-0.1 OP OINT
TOPICAL_OINTMENT | OPHTHALMIC | Status: AC
  Filled 2017-03-21: qty 3.5

## 2017-03-21 MED ORDER — EPINEPHRINE PF 1 MG/ML IJ SOLN
INTRAMUSCULAR | Status: AC
  Filled 2017-03-21: qty 2

## 2017-03-21 MED ORDER — SODIUM CHLORIDE 0.9 % IV SOLN
INTRAVENOUS | Status: DC
  Administered 2017-03-21 (×2): via INTRAVENOUS

## 2017-03-21 MED ORDER — NA HYALUR & NA CHOND-NA HYALUR 0.4-0.35 ML IO KIT
PACK | INTRAOCULAR | Status: DC | PRN
  Administered 2017-03-21: .35 mL via INTRAOCULAR

## 2017-03-21 MED ORDER — MOXIFLOXACIN HCL 0.5 % OP SOLN
1.0000 [drp] | OPHTHALMIC | Status: AC
  Administered 2017-03-21 (×3): 1 [drp] via OPHTHALMIC

## 2017-03-21 MED ORDER — ALFENTANIL 500 MCG/ML IJ INJ
INJECTION | INTRAVENOUS | Status: DC | PRN
  Administered 2017-03-21: 250 ug via INTRAVENOUS
  Administered 2017-03-21: 500 ug via INTRAVENOUS
  Administered 2017-03-21: 250 ug via INTRAVENOUS

## 2017-03-21 MED ORDER — MOXIFLOXACIN HCL 0.5 % OP SOLN
OPHTHALMIC | Status: AC
  Filled 2017-03-21: qty 3

## 2017-03-21 MED ORDER — NEOMYCIN-POLYMYXIN-DEXAMETH 0.1 % OP OINT
TOPICAL_OINTMENT | OPHTHALMIC | Status: DC | PRN
  Administered 2017-03-21: 1 via OPHTHALMIC

## 2017-03-21 MED ORDER — POVIDONE-IODINE 5 % OP SOLN
OPHTHALMIC | Status: DC | PRN
  Administered 2017-03-21: 1 via OPHTHALMIC

## 2017-03-21 MED ORDER — NA HYALUR & NA CHOND-NA HYALUR 0.55-0.5 ML IO KIT
PACK | INTRAOCULAR | Status: AC
  Filled 2017-03-21: qty 1.05

## 2017-03-21 MED ORDER — ARMC OPHTHALMIC DILATING DROPS
OPHTHALMIC | Status: AC
  Administered 2017-03-21: 1 via OPHTHALMIC
  Filled 2017-03-21: qty 0.4

## 2017-03-21 MED ORDER — LIDOCAINE HCL (PF) 4 % IJ SOLN
INTRAOCULAR | Status: DC | PRN
  Administered 2017-03-21: 4 mL via OPHTHALMIC

## 2017-03-21 MED ORDER — LIDOCAINE HCL (PF) 4 % IJ SOLN
INTRAMUSCULAR | Status: AC
  Filled 2017-03-21: qty 5

## 2017-03-21 MED ORDER — POVIDONE-IODINE 5 % OP SOLN
OPHTHALMIC | Status: AC
  Filled 2017-03-21: qty 30

## 2017-03-21 SURGICAL SUPPLY — 15 items
GLOVE BIO SURGEON STRL SZ8 (GLOVE) ×3 IMPLANT
GLOVE BIOGEL M 6.5 STRL (GLOVE) ×3 IMPLANT
GLOVE SURG LX 7.5 STRW (GLOVE) ×2
GLOVE SURG LX STRL 7.5 STRW (GLOVE) ×1 IMPLANT
GOWN STRL REUS W/ TWL LRG LVL3 (GOWN DISPOSABLE) ×2 IMPLANT
GOWN STRL REUS W/TWL LRG LVL3 (GOWN DISPOSABLE) ×4
LENS IOL ACRYSOF IQ 21.0 (Intraocular Lens) ×3 IMPLANT
PACK CATARACT (MISCELLANEOUS) ×3 IMPLANT
PACK CATARACT BRASINGTON LX (MISCELLANEOUS) ×3 IMPLANT
PACK EYE AFTER SURG (MISCELLANEOUS) ×3 IMPLANT
SOL BSS BAG (MISCELLANEOUS) ×3
SOLUTION BSS BAG (MISCELLANEOUS) ×1 IMPLANT
SYR 5ML LL (SYRINGE) ×3 IMPLANT
WATER STERILE IRR 250ML POUR (IV SOLUTION) ×3 IMPLANT
WIPE NON LINTING 3.25X3.25 (MISCELLANEOUS) ×3 IMPLANT

## 2017-03-21 NOTE — H&P (Signed)
The History and Physical notes are on paper, have been signed, and are to be scanned. The patient remains stable and unchanged from the H&P.   Previous H&P reviewed, patient examined, and there are no changes.  Alyssa Holloway 03/21/2017 8:33 AM

## 2017-03-21 NOTE — Anesthesia Postprocedure Evaluation (Signed)
Anesthesia Post Note  Patient: Alyssa Holloway  Procedure(s) Performed: Procedure(s) (LRB): CATARACT EXTRACTION PHACO AND INTRAOCULAR LENS PLACEMENT (IOC) (Left)  Patient location during evaluation: PACU Anesthesia Type: MAC Level of consciousness: awake and alert Pain management: pain level controlled Vital Signs Assessment: post-procedure vital signs reviewed and stable Respiratory status: spontaneous breathing, nonlabored ventilation and respiratory function stable Cardiovascular status: stable and blood pressure returned to baseline Anesthetic complications: no     Last Vitals:  Vitals:   03/21/17 0815 03/21/17 0956  BP: (!) 154/91 123/76  Pulse: 85 94  Resp: 16   Temp: 36.4 C (!) 36.2 C    Last Pain:  Vitals:   03/21/17 0815  TempSrc: Tympanic                 Silvana Newness A

## 2017-03-21 NOTE — Anesthesia Preprocedure Evaluation (Addendum)
Anesthesia Evaluation  Patient identified by MRN, date of birth, ID band Patient awake    Reviewed: Allergy & Precautions, NPO status , Patient's Chart, lab work & pertinent test results, reviewed documented beta blocker date and time   Airway Mallampati: II  TM Distance: >3 FB     Dental  (+) Chipped, Missing   Pulmonary           Cardiovascular hypertension, Pt. on medications and Pt. on home beta blockers      Neuro/Psych Anxiety    GI/Hepatic   Endo/Other  Hypothyroidism   Renal/GU      Musculoskeletal   Abdominal   Peds  Hematology   Anesthesia Other Findings   Reproductive/Obstetrics                            Anesthesia Physical Anesthesia Plan  ASA: III  Anesthesia Plan: MAC   Post-op Pain Management:    Induction:   PONV Risk Score and Plan:   Airway Management Planned:   Additional Equipment:   Intra-op Plan:   Post-operative Plan:   Informed Consent: I have reviewed the patients History and Physical, chart, labs and discussed the procedure including the risks, benefits and alternatives for the proposed anesthesia with the patient or authorized representative who has indicated his/her understanding and acceptance.     Plan Discussed with: CRNA  Anesthesia Plan Comments:         Anesthesia Quick Evaluation

## 2017-03-21 NOTE — Op Note (Signed)
OPERATIVE NOTE  Alyssa Holloway [id number] 03/21/2017   PREOPERATIVE DIAGNOSIS:  Nuclear sclerotic cataract left eye. H25.12   POSTOPERATIVE DIAGNOSIS:    Nuclear sclerotic cataract left eye.     PROCEDURE:  Phacoemusification with posterior chamber intraocular lens placement of the left eye   LENS:   Implant Name Type Inv. Item Serial No. Manufacturer Lot No. LRB No. Used  LENS IOL ACRYSOF IQ 21.0 - [id number] Intraocular Lens LENS IOL ACRYSOF IQ 21.0 [id number] ALCON   Left 1        ULTRASOUND TIME: 24 % of 1 minutes, 39 seconds.  CDE 24.1   SURGEON:  Wyonia Hough, MD   ANESTHESIA:  Topical with tetracaine drops and 2% Xylocaine jelly, augmented with 1% preservative-free intracameral lidocaine.    COMPLICATIONS:  None.   DESCRIPTION OF PROCEDURE:  The patient was identified in the holding room and transported to the operating room and placed in the supine position under the operating microscope.  The left eye was identified as the operative eye and it was prepped and draped in the usual sterile ophthalmic fashion.   A 1 millimeter clear-corneal paracentesis was made at the 1:30 position. 0.5 ml of preservative-free 1% lidocaine was injected into the anterior chamber.  The anterior chamber was filled with Viscoat viscoelastic.  A 2.4 millimeter keratome was used to make a near-clear corneal incision at the 10:30 position.  .  A curvilinear capsulorrhexis was made with a cystotome and capsulorrhexis forceps.  Balanced salt solution was used to hydrodissect and hydrodelineate the nucleus.   Phacoemulsification was then used in stop and chop fashion to remove the lens nucleus and epinucleus.  The remaining cortex was then removed using the irrigation and aspiration handpiece. Provisc was then placed into the capsular bag to distend it for lens placement.  A lens was then injected into the capsular bag.  The remaining viscoelastic was aspirated.   Wounds were hydrated with  balanced salt solution.  The anterior chamber was inflated to a physiologic pressure with balanced salt solution. Vigamox 0.2 ml of a 1mg  per ml solution was injected into the anterior chamber for a dose of 0.2 mg of intracameral antibiotic at the completion of the case.  Miostat was placed into the anterior chamber to constrict the pupil.  No wound leaks were noted.  Topical Vigamox drops and Maxitrol ointment were applied to the eye.  The patient was taken to the recovery room in stable condition without complications of anesthesia or surgery  Derica Leiber 03/21/2017, 9:55 AM

## 2017-03-21 NOTE — Transfer of Care (Signed)
Immediate Anesthesia Transfer of Care Note  Patient: Alyssa Holloway  Procedure(s) Performed: Procedure(s) with comments: CATARACT EXTRACTION PHACO AND INTRAOCULAR LENS PLACEMENT (IOC) (Left) - [place] 1:38.9 AP% 24.4 CDE 24.10 Fluid pack lot # [id number]  Patient Location: PACU  Anesthesia Type:MAC  Level of Consciousness: awake, alert , oriented and patient cooperative  Airway & Oxygen Therapy: Patient Spontanous Breathing  Post-op Assessment: Report given to RN and Post -op Vital signs reviewed and stable  Post vital signs: Reviewed and stable  Last Vitals:  Vitals:   03/21/17 0815 03/21/17 0956  BP: (!) 154/91 123/76  Pulse: 85 94  Resp: 16   Temp: 36.4 C (!) 36.2 C    Last Pain:  Vitals:   03/21/17 0815  TempSrc: Tympanic         Complications: No apparent anesthesia complications

## 2017-03-21 NOTE — OR Nursing (Signed)
Dr. Marcello Moores aware patient did not have beta blocker this am, no new orders.

## 2017-03-21 NOTE — Anesthesia Post-op Follow-up Note (Cosign Needed)
Anesthesia QCDR form completed.        

## 2017-03-21 NOTE — Discharge Instructions (Signed)
Eye Surgery Discharge Instructions  Expect mild scratchy sensation or mild soreness. DO NOT RUB YOUR EYE!  The day of surgery:  Minimal physical activity, but bed rest is not required  No reading, computer work, or close hand work  No bending, lifting, or straining.  May watch TV  For 24 hours:  No driving, legal decisions, or alcoholic beverages  Safety precautions  Eat anything you prefer: It is better to start with liquids, then soup then solid foods.  _____ Eye patch should be worn until postoperative exam tomorrow.  __X__ Solar shield eyeglasses should be worn for comfort in the sunlight/patch while sleeping  Resume all regular medications including aspirin or Coumadin if these were discontinued prior to surgery. You may shower, bathe, shave, or wash your hair. Tylenol may be taken for mild discomfort.  Call your doctor if you experience significant pain, nausea, or vomiting, fever > 101 or other signs of infection. [phone] or [phone] Specific instructions:  Follow-up Information    Leandrew Koyanagi, MD. Go on 03/22/2017.   Specialty:  Ophthalmology Why:  11:00AM Contact information: [address]   [place] [address] [phone]

## 2017-03-22 ENCOUNTER — Encounter: Payer: Self-pay | Admitting: Ophthalmology

## 2018-07-19 ENCOUNTER — Emergency Department: Payer: Medicare Other

## 2018-07-19 ENCOUNTER — Emergency Department
Admission: EM | Admit: 2018-07-19 | Discharge: 2018-07-21 | Disposition: A | Discharge status: Skilled Nursing Facility | Payer: Medicare Other | Attending: Emergency Medicine | Admitting: Emergency Medicine

## 2018-07-19 ENCOUNTER — Other Ambulatory Visit: Payer: Self-pay

## 2018-07-19 DIAGNOSIS — E039 Hypothyroidism, unspecified: Secondary | ICD-10-CM | POA: Diagnosis not present

## 2018-07-19 DIAGNOSIS — E876 Hypokalemia: Secondary | ICD-10-CM | POA: Diagnosis not present

## 2018-07-19 DIAGNOSIS — Y9389 Activity, other specified: Secondary | ICD-10-CM | POA: Diagnosis not present

## 2018-07-19 DIAGNOSIS — Y92003 Bedroom of unspecified non-institutional (private) residence as the place of occurrence of the external cause: Secondary | ICD-10-CM | POA: Insufficient documentation

## 2018-07-19 DIAGNOSIS — R410 Disorientation, unspecified: Secondary | ICD-10-CM | POA: Insufficient documentation

## 2018-07-19 DIAGNOSIS — R4182 Altered mental status, unspecified: Secondary | ICD-10-CM

## 2018-07-19 DIAGNOSIS — Z7982 Long term (current) use of aspirin: Secondary | ICD-10-CM | POA: Insufficient documentation

## 2018-07-19 DIAGNOSIS — Y998 Other external cause status: Secondary | ICD-10-CM | POA: Insufficient documentation

## 2018-07-19 DIAGNOSIS — S82831A Other fracture of upper and lower end of right fibula, initial encounter for closed fracture: Secondary | ICD-10-CM | POA: Insufficient documentation

## 2018-07-19 DIAGNOSIS — I10 Essential (primary) hypertension: Secondary | ICD-10-CM | POA: Insufficient documentation

## 2018-07-19 DIAGNOSIS — Z9181 History of falling: Secondary | ICD-10-CM | POA: Insufficient documentation

## 2018-07-19 DIAGNOSIS — S82891A Other fracture of right lower leg, initial encounter for closed fracture: Secondary | ICD-10-CM

## 2018-07-19 DIAGNOSIS — Z853 Personal history of malignant neoplasm of breast: Secondary | ICD-10-CM | POA: Diagnosis not present

## 2018-07-19 DIAGNOSIS — S99911A Unspecified injury of right ankle, initial encounter: Secondary | ICD-10-CM | POA: Diagnosis present

## 2018-07-19 DIAGNOSIS — Z79899 Other long term (current) drug therapy: Secondary | ICD-10-CM | POA: Diagnosis not present

## 2018-07-19 DIAGNOSIS — W06XXXA Fall from bed, initial encounter: Secondary | ICD-10-CM | POA: Insufficient documentation

## 2018-07-19 DIAGNOSIS — F419 Anxiety disorder, unspecified: Secondary | ICD-10-CM | POA: Diagnosis not present

## 2018-07-19 DIAGNOSIS — S82841A Displaced bimalleolar fracture of right lower leg, initial encounter for closed fracture: Secondary | ICD-10-CM | POA: Diagnosis not present

## 2018-07-19 DIAGNOSIS — W19XXXA Unspecified fall, initial encounter: Secondary | ICD-10-CM

## 2018-07-19 LAB — COMPREHENSIVE METABOLIC PANEL
ALT: 14 U/L (ref 0–44)
AST: 19 U/L (ref 15–41)
Albumin: 4 g/dL (ref 3.5–5.0)
Alkaline Phosphatase: 69 U/L (ref 38–126)
Anion gap: 13 (ref 5–15)
BUN: 15 mg/dL (ref 8–23)
CHLORIDE: 105 mmol/L (ref 98–111)
CO2: 27 mmol/L (ref 22–32)
CREATININE: 0.72 mg/dL (ref 0.44–1.00)
Calcium: 8.6 mg/dL — ABNORMAL LOW (ref 8.9–10.3)
GFR calc Af Amer: >60 mL/min (ref >60)
Glucose, Bld: 152 mg/dL — ABNORMAL HIGH (ref 70–99)
Potassium: 3 mmol/L — ABNORMAL LOW (ref 3.5–5.1)
SODIUM: 145 mmol/L (ref 135–145)
Total Bilirubin: 1.1 mg/dL (ref 0.3–1.2)
Total Protein: 6.9 g/dL (ref 6.5–8.1)

## 2018-07-19 LAB — URINALYSIS, COMPLETE (UACMP) WITH MICROSCOPIC
Bilirubin Urine: NEGATIVE
Glucose, UA: NEGATIVE mg/dL
Hgb urine dipstick: NEGATIVE
Ketones, ur: 5 mg/dL — AB
NITRITE: NEGATIVE
PROTEIN: NEGATIVE mg/dL
SPECIFIC GRAVITY, URINE: 1.019 (ref 1.005–1.030)
pH: 6 (ref 5.0–8.0)

## 2018-07-19 LAB — CBC WITH DIFFERENTIAL/PLATELET
ABS IMMATURE GRANULOCYTES: 0.01 10*3/uL (ref 0.00–0.07)
Basophils Absolute: 0 10*3/uL (ref 0.0–0.1)
Basophils Relative: 1 %
EOS ABS: 0.2 10*3/uL (ref 0.0–0.5)
EOS PCT: 3 %
HCT: 46 % (ref 36.0–46.0)
Hemoglobin: 15 g/dL (ref 12.0–15.0)
Immature Granulocytes: 0 %
Lymphocytes Relative: 48 %
Lymphs Abs: 2.7 10*3/uL (ref 0.7–4.0)
MCH: 31.8 pg (ref 26.0–34.0)
MCHC: 32.6 g/dL (ref 30.0–36.0)
MCV: 97.7 fL (ref 80.0–100.0)
MONOS PCT: 8 %
Monocytes Absolute: 0.4 10*3/uL (ref 0.1–1.0)
Neutro Abs: 2.2 10*3/uL (ref 1.7–7.7)
Neutrophils Relative %: 40 %
PLATELETS: 124 10*3/uL — AB (ref 150–400)
RBC: 4.71 MIL/uL (ref 3.87–5.11)
RDW: 13.1 % (ref 11.5–15.5)
WBC: 5.6 10*3/uL (ref 4.0–10.5)
nRBC: 0 % (ref 0.0–0.2)

## 2018-07-19 LAB — PROTIME-INR
INR: 0.89
Prothrombin Time: 12 seconds (ref 11.4–15.2)

## 2018-07-19 LAB — CK: CK TOTAL: 64 U/L (ref 38–234)

## 2018-07-19 LAB — TROPONIN I

## 2018-07-19 MED ORDER — BENAZEPRIL HCL 20 MG PO TABS
40.0000 mg | ORAL_TABLET | Freq: Every day | ORAL | Status: DC
  Administered 2018-07-20: 40 mg via ORAL
  Filled 2018-07-19 (×3): qty 2

## 2018-07-19 MED ORDER — MORPHINE SULFATE (PF) 2 MG/ML IV SOLN
2.0000 mg | Freq: Once | INTRAVENOUS | Status: AC
  Administered 2018-07-19: 2 mg via INTRAVENOUS

## 2018-07-19 MED ORDER — OLANZAPINE 5 MG PO TABS
2.5000 mg | ORAL_TABLET | Freq: Once | ORAL | Status: AC
  Administered 2018-07-19: 2.5 mg via ORAL
  Filled 2018-07-19: qty 1

## 2018-07-19 MED ORDER — MORPHINE SULFATE (PF) 2 MG/ML IV SOLN
INTRAVENOUS | Status: AC
  Administered 2018-07-19: 2 mg via INTRAVENOUS
  Filled 2018-07-19: qty 1

## 2018-07-19 MED ORDER — LEVOTHYROXINE SODIUM 50 MCG PO TABS
125.0000 ug | ORAL_TABLET | Freq: Every day | ORAL | Status: DC
  Administered 2018-07-19 – 2018-07-21 (×3): 125 ug via ORAL
  Filled 2018-07-19 (×2): qty 3
  Filled 2018-07-19: qty 2.5

## 2018-07-19 MED ORDER — ASPIRIN 81 MG PO CHEW
81.0000 mg | CHEWABLE_TABLET | Freq: Every day | ORAL | Status: DC
  Administered 2018-07-19 – 2018-07-21 (×3): 81 mg via ORAL
  Filled 2018-07-19 (×3): qty 1

## 2018-07-19 MED ORDER — POTASSIUM CHLORIDE CRYS ER 20 MEQ PO TBCR
40.0000 meq | EXTENDED_RELEASE_TABLET | Freq: Once | ORAL | Status: AC
  Administered 2018-07-19: 40 meq via ORAL
  Filled 2018-07-19: qty 2

## 2018-07-19 MED ORDER — HALOPERIDOL LACTATE 5 MG/ML IJ SOLN
2.5000 mg | Freq: Once | INTRAMUSCULAR | Status: AC
  Administered 2018-07-19: 2.5 mg via INTRAVENOUS
  Filled 2018-07-19: qty 1

## 2018-07-19 MED ORDER — ONDANSETRON HCL 4 MG/2ML IJ SOLN
4.0000 mg | Freq: Once | INTRAMUSCULAR | Status: AC
  Administered 2018-07-19: 4 mg via INTRAVENOUS

## 2018-07-19 MED ORDER — ONDANSETRON HCL 4 MG/2ML IJ SOLN
INTRAMUSCULAR | Status: AC
  Administered 2018-07-19: 4 mg via INTRAVENOUS
  Filled 2018-07-19: qty 2

## 2018-07-19 MED ORDER — FENTANYL CITRATE (PF) 100 MCG/2ML IJ SOLN
INTRAMUSCULAR | Status: AC
  Administered 2018-07-19: 25 ug via INTRAVENOUS
  Filled 2018-07-19: qty 2

## 2018-07-19 MED ORDER — METOPROLOL TARTRATE 50 MG PO TABS
100.0000 mg | ORAL_TABLET | Freq: Every day | ORAL | Status: DC
  Administered 2018-07-20: 100 mg via ORAL
  Filled 2018-07-19 (×3): qty 2

## 2018-07-19 MED ORDER — AMLODIPINE BESYLATE 5 MG PO TABS
10.0000 mg | ORAL_TABLET | Freq: Every day | ORAL | Status: DC
  Administered 2018-07-20: 10 mg via ORAL
  Filled 2018-07-19 (×3): qty 2

## 2018-07-19 MED ORDER — FENTANYL CITRATE (PF) 100 MCG/2ML IJ SOLN
25.0000 ug | Freq: Once | INTRAMUSCULAR | Status: AC
  Administered 2018-07-19: 25 ug via INTRAVENOUS

## 2018-07-19 MED ORDER — TRAMADOL HCL 50 MG PO TABS
50.0000 mg | ORAL_TABLET | ORAL | Status: DC | PRN
  Administered 2018-07-19 – 2018-07-20 (×4): 50 mg via ORAL
  Filled 2018-07-19 (×4): qty 1

## 2018-07-19 NOTE — ED Notes (Signed)
Pt states that her pain still haven't gotten any better even with the pain medication and mustard. MD notified.

## 2018-07-19 NOTE — ED Notes (Signed)
Pt complaining of muscle cramps in both legs in addition to pain in the right foot. MD notified and told this nurse to give some mustard to try to stop the cramping.

## 2018-07-19 NOTE — ED Triage Notes (Signed)
Pt arrived via [facility] from home with c/o foot/ankle injury. EMS states that pt life alarm went off and pt was found in the floor. EMS states that pt was hyperventilating and then when pt went to stand up EMS noticed a deformity in the right ankle. Pt lives at home alone.

## 2018-07-19 NOTE — Clinical Social Work Note (Signed)
CSW met with patient at bedside this afternoon and her minister was present. Patient was in pain and CSW asked if patient's daughter: [name]: [phone] had a cell phone. The minister provided the number above. CSW spoke with Mrs. Alyssa Holloway and explained that PT had recommended short term rehab. CSW explained that if they wish to pursue this option, patient would require authorization from her insurance and that they are not open on the weekends and can take 24 to 48 hours to provide a disposition. This would mean patient remaining in the ED for a potential of 2-3 more days. CSW explained that this would not be the ideal situation for her. CSW also discussed the option of taking her home with 24/7 care (she and her sister are both on their way to see their mother as they live a few hours away). Patient's daughter had some questions regarding if they could manage patient's pain at home and had some medical questions for the physician. CSW informed her that CSW would let the nurse know to see if the physician could call her. Patient's daughter verbalized understanding and will be thinking about what she and her sister wish to do. CSW contacted patient's nurse and made him aware. Shela Leff MSW,LCSW [phone]

## 2018-07-19 NOTE — ED Notes (Signed)
Patient placed in hospital bed for comfort measures. Daughter at bedside.

## 2018-07-19 NOTE — ED Notes (Signed)
Pt to CT

## 2018-07-19 NOTE — ED Notes (Signed)
Meal tray provided as ordered. Neighbor at bedside.

## 2018-07-19 NOTE — ED Notes (Signed)
BP medication held due to soft BPs. Other scheduled medication given per orders.

## 2018-07-19 NOTE — ED Notes (Signed)
Per social work, pending PT assessment for possible placement.

## 2018-07-19 NOTE — Plan of Care (Signed)
To return to home mobility.  

## 2018-07-19 NOTE — ED Notes (Signed)
Per PT, ETA 5 mins to assess.

## 2018-07-19 NOTE — ED Notes (Signed)
Pt placed on bedpan per request. Unable to send urine for UA due to stool contamination.

## 2018-07-19 NOTE — Progress Notes (Signed)
   07/19/18 0700  Clinical Encounter Type  Visited With Patient  Visit Type Initial;Spiritual support  Recommendations Follow-up, as needed.  Spiritual Encounters  Spiritual Needs Emotional (Anxiety control.)  Stress Factors  Patient Stress Factors  (Pain and anxiety.)   Chaplain noted that the patient was alone and crying loudly for help. Chaplain engaged the patient, held her hand and offered emotional support until her nurse could arrive with medications. The patient responded well to pastoral care and her anxiety and her complaints of pain decreased.

## 2018-07-19 NOTE — ED Provider Notes (Addendum)
[facility] Emergency Department Provider Note   ____________________________________________   First MD Initiated Contact with Patient 07/19/18 [phone]     (approximate)  I have reviewed the triage vital signs and the nursing notes.   HISTORY  Chief Complaint Foot Injury    HPI Alyssa Holloway is a 84 y.o. female brought to the ED from home via EMS with a chief complaint of right foot and ankle injury/pain.  Denies associated extremity weakness, numbness or tingling.  Patient reports falling out of bed approximately midnight.  Thinks she has been laying on the floor all night.  EMS reports patient lives at home alone and that she was hyperventilating when they found her.  Patient denies use of anticoagulants.  Does not think she struck her head or suffered LOC.  Denies head pain, vision changes, neck pain, chest pain, shortness of breath, abdominal pain, nausea or vomiting.   Past Medical History:  Diagnosis Date  . Anxiety   . Fibrocystic breast disease   . Hemorrhoid   . Hyperlipidemia   . Hypertension   . Hypothyroidism   . Malignant neoplasm of upper-outer quadrant of female breast ([place]) August 25, 2003   Invasive ductal carcinoma, T2, N0.   ER/PR negative, HER-2/neu 3+.  . Rectal bleeding   . Thyroid disease     Patient Active Problem List   Diagnosis Date Noted  . Internal hemorrhoid 05/11/2014    Past Surgical History:  Procedure Laterality Date  . APPENDECTOMY    . BREAST SURGERY Right    lumpectomy  . CATARACT EXTRACTION W/PHACO Left 03/21/2017   Procedure: CATARACT EXTRACTION PHACO AND INTRAOCULAR LENS PLACEMENT (IOC);  Surgeon: Leandrew Koyanagi, MD;  Location: [facility] ORS;  Service: Ophthalmology;  Laterality: Left;  [place] 1:38.9 AP% 24.4 CDE 24.10 Fluid pack lot # [id number]  . HEMORRHOID BANDING  August 2015  . STAPLE HEMORRHOIDECTOMY  April 09, 2006  . THYROID SURGERY      Prior to Admission medications   Medication Sig Start  Date End Date Taking? Authorizing Provider  amLODipine (NORVASC) 5 MG tablet Take 10 mg by mouth daily.     [provider]  ascorbic acid (VITAMIN C) 1000 MG tablet Take 1,000 mg by mouth daily.    [provider]  aspirin 81 MG tablet Take 81 mg by mouth daily.    [provider]  benazepril (LOTENSIN) 20 MG tablet Take 40 mg by mouth daily.     [provider]  cholecalciferol (VITAMIN D) 1000 UNITS tablet Take 1,000 Units by mouth daily.    [provider]  furosemide (LASIX) 20 MG tablet Take 1 tablet by mouth daily as needed. 03/22/14   [provider]  levothyroxine (SYNTHROID, LEVOTHROID) 125 MCG tablet Take 1 tablet by mouth daily. 03/22/14   [provider]  lidocaine (XYLOCAINE) 5 % ointment Apply 1 application topically 3 (three) times daily as needed. 05/26/14   Robert Bellow, MD  lovastatin (MEVACOR) 20 MG tablet Take 1 tablet by mouth daily. 02/20/14   [provider]  metoprolol (LOPRESSOR) 100 MG tablet Take 1 tablet by mouth daily. 03/22/14   [provider]  Multiple Vitamin (MULTIVITAMIN) tablet Take 1 tablet by mouth daily.    [provider]  Omega-3 Fatty Acids (FISH OIL PO) Take by mouth.    [provider]    Allergies Vicodin [hydrocodone-acetaminophen]  History reviewed. No pertinent family history.  Social History Social History   Tobacco  Use  . Smoking status: Never Smoker  . Smokeless tobacco: Never Used  Substance Use Topics  . Alcohol use: Yes  . Drug use: No    Review of Systems  Constitutional: No fever/chills Eyes: No visual changes. ENT: No sore throat. Cardiovascular: Denies chest pain. Respiratory: Denies shortness of breath. Gastrointestinal: No abdominal pain.  No nausea, no vomiting.  No diarrhea.  No constipation. Genitourinary: Negative for dysuria. Musculoskeletal: Positive for right ankle and foot pain.  Negative for back pain. Skin:  Negative for rash. Neurological: Negative for headaches, focal weakness or numbness.   ____________________________________________   PHYSICAL EXAM:  VITAL SIGNS: ED Triage Vitals  Enc Vitals Group     BP 07/19/18 0557 121/85     Pulse Rate 07/19/18 0557 86     Resp --      Temp --      Temp src --      SpO2 07/19/18 0557 98 %     Weight 07/19/18 0600 145 lb (65.8 kg)     Height 07/19/18 0600 _0  (1.676 m)     Head Circumference --      Peak Flow --      Pain Score 07/19/18 0559 8     Pain Loc --      Pain Edu? --      Excl. in [place]? --     Constitutional: Alert and oriented.  Elderly appearing and in moderate acute distress. Eyes: Conjunctivae are normal. PERRL. EOMI. Head: Atraumatic. Nose: No congestion/rhinnorhea. Mouth/Throat: Mucous membranes are moist.  Oropharynx non-erythematous. Neck: No stridor.  No cervical spine tenderness to palpation. Cardiovascular: Normal rate, regular rhythm. Grossly normal heart sounds.  Good peripheral circulation. Respiratory: Normal respiratory effort.  No retractions. Lungs CTAB. Gastrointestinal: Soft and nontender. No distention. No abdominal bruits. No CVA tenderness. Musculoskeletal:  RLE: Moderate swelling to right ankle and foot.  Tender to palpation.  Limited range of motion secondary to pain.  2+ distal pulses.  Brisk, less than 5-second capillary refill. LLE: Cramping to foot and lower leg. Neurologic:  Normal speech and language. No gross focal neurologic deficits are appreciated.  Skin:  Skin is warm, dry and intact. No rash noted. Psychiatric: Mood and affect are normal. Speech and behavior are normal.  ____________________________________________   LABS (all labs ordered are listed, but only abnormal results are displayed)  Labs Reviewed  CBC WITH DIFFERENTIAL/PLATELET - Abnormal; Notable for the following components:      Result Value   Platelets 124 (*)    All other components within normal limits    COMPREHENSIVE METABOLIC PANEL - Abnormal; Notable for the following components:   Potassium 3.0 (*)    Glucose, Bld 152 (*)    Calcium 8.6 (*)    All other components within normal limits  URINALYSIS, COMPLETE (UACMP) WITH MICROSCOPIC - Abnormal; Notable for the following components:   Color, Urine YELLOW (*)    APPearance CLEAR (*)    Ketones, ur 5 (*)    Leukocytes, UA TRACE (*)    Bacteria, UA RARE (*)    All other components within normal limits  CBC WITH DIFFERENTIAL/PLATELET - Abnormal; Notable for the following components:   Platelets 147 (*)    All other components within normal limits  COMPREHENSIVE METABOLIC PANEL - Abnormal; Notable for the following components:   Glucose, Bld 107 (*)    Total Bilirubin 1.5 (*)    All other components within normal limits  PROTIME-INR  TROPONIN I  CK  TROPONIN I  TSH  T4, FREE  CBG MONITORING, ED   ____________________________________________  EKG  None ____________________________________________  RADIOLOGY  ED MD interpretation: Bimalleolar right ankle fracture; no foot fracture; no ICH  Official radiology report(s): No results found.  ____________________________________________   PROCEDURES  Procedure(s) performed: None  Procedures  Critical Care performed: Yes, see critical care note(s)  ____________________________________________   INITIAL IMPRESSION / ASSESSMENT AND PLAN / ED COURSE  As part of my medical decision making, I reviewed the following data within the [place] notes reviewed and incorporated, Labs reviewed, Old chart reviewed, Radiograph reviewed  and Notes from prior ED visits   84 year old female who presents status post fall with right ankle and foot injury/pain.  Seems more concerned regarding the left leg cramps.  Will obtain basic lab work, x-rays of right foot and ankle, CT head to evaluate intracranial hemorrhage.  IV fluid resuscitation initiated, 25 mcg  IV fentanyl given for pain.   Clinical Course as of Jul 24 422  Sat Jul 19, 2018  0655 Spoke with Dr. Rudene Christians who reviewed patient's images.  Advise splint and minimal weightbearing for now.  I do not feel patient would be safe to be discharged home as she lives independently.  She would have a very difficult time getting around with a walker and splint.  If she does not meet criteria for hospitalization, I will consult clinical social work and physical therapy to assess her for placement.   [JS]  0730 Care transferred to Dr. Clearnce Hasten.  CK is pending.  If significantly elevated, then patient may be admitted for rhabdomyolysis.  If not, will proceed with plan for clinical social work and physical therapy consults.   [JS]  [id number] All results noted except for urinalysis which patient has yet to provide specimen.  Will place social work and physical therapy consults.  Home meds have been ordered.   [JS]  Sun Jul 20, 2018  1816 Patient has had mental status changes, we will reevaluate her with imaging and blood work.   [JW]    Clinical Course User Index [JS] Paulette Blanch, MD [JW] Earleen Newport, MD     ____________________________________________   FINAL CLINICAL IMPRESSION(S) / ED DIAGNOSES  Final diagnoses:  Closed fracture of right ankle, initial encounter  Fall, initial encounter  Hypokalemia     ED Discharge Orders    None       Note:  This document was prepared using Dragon voice recognition software and may include unintentional dictation errors.    Paulette Blanch, MD 07/19/18 2703    Paulette Blanch, MD 07/24/18 [phone]

## 2018-07-19 NOTE — ED Notes (Signed)
Talked to daughter Hilda Blades) and stated that she is on the way to hospital. Update given per patient request. PT in room at this time to evaluate.

## 2018-07-19 NOTE — Evaluation (Signed)
Physical Therapy Evaluation Patient Details Name: Alyssa Holloway MRN: [id number] DOB: 1920/10/24 Today's Date: 07/19/2018   History of Present Illness  Patient is a pleasant 84 y/o female that presents with acute R ankle pain. She fell at home and sustained an acute mildly displaced medial malleolus and distal fibular fx. She is currently splinted and NWBing.   Clinical Impression  Patient is a 84 y/o female that presents with R acute ankle fracture. She is currently considered to be NWBing and orthopedics is currently managing non-operatively. She was previously a Hydrographic surveyor independently, with assistance driving to get to her appointments and grocery store. She is able to transfer and perform bed mobility with limited assistance, however is not appropriate to attempt hopping or other OOB mobility. Given her stark change in mobility, she would likely benefit from short term rehabilitation when medically appropriate.     Follow Up Recommendations SNF    Equipment Recommendations  Rolling walker with 5" wheels    Recommendations for Other Services       Precautions / Restrictions Precautions Precautions: Fall Restrictions Weight Bearing Restrictions: Yes RLE Weight Bearing: Non weight bearing      Mobility  Bed Mobility Overal bed mobility: Needs Assistance Bed Mobility: Supine to Sit;Sit to Supine     Supine to sit: Min assist Sit to supine: Min assist   General bed mobility comments: Patient requires minimal assistance to stabilize torso and LEs throughout transfer.   Transfers Overall transfer level: Needs assistance Equipment used: Rolling walker (2 wheeled) Transfers: Sit to/from Stand Sit to Stand: Min assist;Mod assist         General transfer comment: Patient is able to perform sit to stand transfer with cuing to maintain NWBing status on RLE appropriately.   Ambulation/Gait             General Gait Details: Deferred given poor standing  balance.   Stairs            Wheelchair Mobility    Modified Rankin (Stroke Patients Only)       Balance Overall balance assessment: Needs assistance;History of Falls Sitting-balance support: Bilateral upper extremity supported;Feet supported Sitting balance-Leahy Scale: Good     Standing balance support: Bilateral upper extremity supported Standing balance-Leahy Scale: Poor                               Pertinent Vitals/Pain Pain Assessment: Faces Faces Pain Scale: Hurts a little bit Pain Location: R ankle  Pain Descriptors / Indicators: Aching Pain Intervention(s): Limited activity within patient's tolerance;Repositioned;Monitored during session    Home Living Family/patient expects to be discharged to:: Private residence Living Arrangements: Alone Available Help at Discharge: Available PRN/intermittently;Friend(s) Type of Home: House Home Access: (Did not specify)     Home Layout: One level Home Equipment: None      Prior Function Level of Independence: Independent               Hand Dominance        Extremity/Trunk Assessment        Lower Extremity Assessment Lower Extremity Assessment: RLE deficits/detail RLE Deficits / Details: Able to extend her R knee and perform SLR  RLE: Unable to fully assess due to immobilization       Communication   Communication: No difficulties  Cognition Arousal/Alertness: Awake/alert Behavior During Therapy: WFL for tasks assessed/performed Overall Cognitive Status: Within Functional Limits for tasks assessed  General Comments General comments (skin integrity, edema, etc.): RLE casted at the ankle     Exercises     Assessment/Plan    PT Assessment Patient needs continued PT services  PT Problem List Decreased strength;Pain;Decreased range of motion;Decreased activity tolerance;Decreased knowledge of use of DME;Decreased  balance;Decreased safety awareness;Decreased knowledge of precautions;Decreased mobility       PT Treatment Interventions Gait training;DME instruction;Therapeutic activities;Therapeutic exercise;Patient/family education;Stair training;Balance training;Neuromuscular re-education    PT Goals (Current goals can be found in the Care Plan section)  Acute Rehab PT Goals Patient Stated Goal: To return to home mobility.  PT Goal Formulation: With patient Time For Goal Achievement: 08/02/18 Potential to Achieve Goals: Good    Frequency 7X/week   Barriers to discharge Inaccessible home environment;Decreased caregiver support      Co-evaluation               AM-PAC PT "6 Clicks" Daily Activity  Outcome Measure Difficulty turning over in bed (including adjusting bedclothes, sheets and blankets)?: A Little Difficulty moving from lying on back to sitting on the side of the bed? : A Little Difficulty sitting down on and standing up from a chair with arms (e.g., wheelchair, bedside commode, etc,.)?: A Lot Help needed moving to and from a bed to chair (including a wheelchair)?: Total Help needed walking in hospital room?: Total Help needed climbing 3-5 steps with a railing? : Total 6 Click Score: 11    End of Session Equipment Utilized During Treatment: Gait belt Activity Tolerance: Patient tolerated treatment well Patient left: in bed;with call bell/phone within reach Nurse Communication: Mobility status PT Visit Diagnosis: History of falling (Z91.81);Difficulty in walking, not elsewhere classified (R26.2)    Time: 5974-1638 PT Time Calculation (min) (ACUTE ONLY): 15 min   Charges:   PT Evaluation $PT Eval Moderate Complexity: 1 Mod          Royce Macadamia PT, DPT, CSCS   07/19/2018, 1:16 PM

## 2018-07-19 NOTE — Clinical Social Work Note (Addendum)
CSW received call from [place] [facility] stating that patient's daughter, Mrs. Jeannene Patella, called her and wanted her to begin prior auth for her mother to go there. CSW contacted Mrs. Jeannene Patella to confirm this and she was able to confirm. Shela Leff MSW,LCSW [phone]

## 2018-07-20 ENCOUNTER — Emergency Department: Payer: Medicare Other

## 2018-07-20 LAB — CBC WITH DIFFERENTIAL/PLATELET
Abs Immature Granulocytes: 0.02 10*3/uL (ref 0.00–0.07)
BASOS ABS: 0 10*3/uL (ref 0.0–0.1)
BASOS PCT: 0 %
Eosinophils Absolute: 0.1 10*3/uL (ref 0.0–0.5)
Eosinophils Relative: 2 %
HCT: 44 % (ref 36.0–46.0)
HEMOGLOBIN: 14.2 g/dL (ref 12.0–15.0)
IMMATURE GRANULOCYTES: 0 %
LYMPHS PCT: 16 %
Lymphs Abs: 1.2 10*3/uL (ref 0.7–4.0)
MCH: 31.4 pg (ref 26.0–34.0)
MCHC: 32.3 g/dL (ref 30.0–36.0)
MCV: 97.3 fL (ref 80.0–100.0)
Monocytes Absolute: 0.6 10*3/uL (ref 0.1–1.0)
Monocytes Relative: 7 %
NEUTROS ABS: 5.5 10*3/uL (ref 1.7–7.7)
NEUTROS PCT: 75 %
PLATELETS: 147 10*3/uL — AB (ref 150–400)
RBC: 4.52 MIL/uL (ref 3.87–5.11)
RDW: 13.2 % (ref 11.5–15.5)
WBC: 7.5 10*3/uL (ref 4.0–10.5)
nRBC: 0 % (ref 0.0–0.2)

## 2018-07-20 LAB — COMPREHENSIVE METABOLIC PANEL
ALBUMIN: 4.1 g/dL (ref 3.5–5.0)
ALT: 19 U/L (ref 0–44)
AST: 33 U/L (ref 15–41)
Alkaline Phosphatase: 67 U/L (ref 38–126)
Anion gap: 6 (ref 5–15)
BUN: 14 mg/dL (ref 8–23)
CHLORIDE: 105 mmol/L (ref 98–111)
CO2: 28 mmol/L (ref 22–32)
CREATININE: 0.7 mg/dL (ref 0.44–1.00)
Calcium: 9.1 mg/dL (ref 8.9–10.3)
GFR calc non Af Amer: >60 mL/min (ref >60)
GLUCOSE: 107 mg/dL — AB (ref 70–99)
Potassium: 4.6 mmol/L (ref 3.5–5.1)
SODIUM: 139 mmol/L (ref 135–145)
Total Bilirubin: 1.5 mg/dL — ABNORMAL HIGH (ref 0.3–1.2)
Total Protein: 6.6 g/dL (ref 6.5–8.1)

## 2018-07-20 LAB — T4, FREE: Free T4: 1.1 ng/dL (ref 0.82–1.77)

## 2018-07-20 LAB — TSH: TSH: 1.662 u[IU]/mL (ref 0.350–4.500)

## 2018-07-20 LAB — TROPONIN I: Troponin I: <0.03 ng/mL (ref <0.03)

## 2018-07-20 NOTE — ED Notes (Signed)
Pt placed on bedpan.  Breakfast heated up for pt.

## 2018-07-20 NOTE — ED Notes (Signed)
Pt provided bedpan and briefs changed.

## 2018-07-20 NOTE — ED Notes (Signed)
Fall mats and yellow socks placed on pt. Bed alarm on. Pt assisted to bedpan.

## 2018-07-20 NOTE — ED Notes (Signed)
Patient transported to CT 

## 2018-07-20 NOTE — ED Notes (Signed)
Pt daughter in room - offered pt supper tray and she refused - the daughter states that she will assist the pt in eating when she is ready to eat - assisted pt to use bedpan to void Daughter request that pt not be given tramadol because she feels like it is sedating the pt - daughter reports that the pt seems more confused and disoriented that usual

## 2018-07-20 NOTE — ED Notes (Signed)
Right ankle splinted again after it was unwrapped for MD to eval - noted to have sore on inner aspect of right ankle - the sore is red around the edges and black in the center - no drainage noted

## 2018-07-20 NOTE — ED Notes (Signed)
Pt sleeping.  Breakfast tray in room for when wakes.  Unlabored, NAD

## 2018-07-20 NOTE — ED Notes (Signed)
Discussed pt care with daughter - discussed confusion of days and nights, pain medications, and pt constantly clearing throat - pt given water and repositioned

## 2018-07-20 NOTE — ED Notes (Signed)
Lunch tray left at RN seat

## 2018-07-20 NOTE — ED Notes (Signed)
Breakfast tray given to RN

## 2018-07-20 NOTE — ED Notes (Signed)
Pt urinated in bedpan with assistance by this RN and tech [place].

## 2018-07-20 NOTE — ED Provider Notes (Signed)
Patient's repeat work-up has been negative.  Reportedly she did not sleep the night before and could be sundowning.  I will try to ensure that she rests tonight.  She still does not meet hospital admission criteria.   Earleen Newport, MD 07/20/18 [id number]

## 2018-07-20 NOTE — ED Provider Notes (Signed)
-----------------------------------------   7:23 AM on 07/20/2018 -----------------------------------------   Blood pressure 135/62, pulse 92, resp. rate 17, height 5\' 6"  (1.676 m), weight 65.8 kg, SpO2 93 %.  The patient had no acute events since last update.  Patient has suffered an acute ankle fracture, otherwise lives independently previously, thought to be unsafe to discharge home alone.  Clinical social worker is currently working with the patient as well as [place] [facility] to hopefully place the patient into skilled nursing/rehab for her recovery.  Awaiting social work disposition.     Harvest Dark, MD 07/20/18 [phone]

## 2018-07-20 NOTE — ED Notes (Signed)
Pt urinated in bedpan with assistance by this RN.

## 2018-07-20 NOTE — ED Notes (Signed)
Pt provided bedpan upon request with help.

## 2018-07-20 NOTE — ED Notes (Signed)
Assessed pt with Silvio Clayman. Pt has change in LOC compared to yesterday with difficulty swallowing when attempted to give pt drink per pt request. Pt drinking fluids independently yesterday. Daughter reports changes as well as being new and denies history. Pt living by herself prior to being brought to ED following fall. Jimmye Norman MD notified.

## 2018-07-20 NOTE — ED Notes (Addendum)
Pt assisted to use bedpan to void Pt appears to this nurse to be lethargic and confused - she is constantly clearing throat and sounds congested - when given a swallow of juice she immediately became choked and had to be sat straight up to catch her breath and cough to clear her throat Had Hunter RN look at pt because he had her yesterday and he reports a marked change of LOC and ablility to speak and swallow Will notify Dr Jimmye Norman

## 2018-07-20 NOTE — ED Notes (Signed)
Pt provided bedpan with help.

## 2018-07-20 NOTE — ED Notes (Signed)
Pt urinated in bedpan with assistance by this RN and Ophelia Charter, Therapist, sports.

## 2018-07-20 NOTE — Progress Notes (Signed)
Physical Therapy Treatment Patient Details Name: Alyssa Holloway MRN: [id number] DOB: [birth year] Today's Date: 07/20/2018    History of Present Illness Patient is a pleasant 84 y/o female that presents with acute R ankle pain. She fell at home and sustained an acute mildly displaced medial malleolus and distal fibular fx. She is currently splinted and NWBing.     PT Comments    Patient more confused than baseline today; pt's daughter feels this is related to medication.  Pt willing to work with PT but continued to close her eyes during treatment, appearing  Very lethargic.  She also did not demosntrate safe awareness of NWB status in order to perform STS from bedside.  Pt required min-mod A to get to EOB and was able to perform some LE exercises with pillow placed beneath R LE.  Pt demonstrated poor sitting posture during exercises.  PT and pt's daughter provided max A to return to bed and pt was able to assist in scooting up in bed with UE's and L LE.  Pt demonstrated good LE strength, performing SLR's with L LE and with min A for RLE.  Pt's daughter concerned about rehabilitation process and PT took time to educate concerning pain management and rehab timeline.  Pt will continue to benefit from skilled PT with focus on safe functional mobility, fall prevention and strengthening.  Pt is still appropriate for SNF at discharge.   Follow Up Recommendations  SNF     Equipment Recommendations  Rolling walker with 5" wheels    Recommendations for Other Services       Precautions / Restrictions Precautions Precautions: Fall Restrictions Weight Bearing Restrictions: Yes RLE Weight Bearing: Non weight bearing    Mobility  Bed Mobility Overal bed mobility: Needs Assistance Bed Mobility: Supine to Sit;Sit to Supine     Supine to sit: Mod assist Sit to supine: Mod assist;Max assist   General bed mobility comments: Assistance to bring LE's over EOB and and to sit upright.  Pt required max  A to return to bed.  Very lethargic and closing eyes when PT talking to pt.  Transfers                 General transfer comment: Unsafe to transfer.  Pt not demonstrating awareness of NWB status and with poor balance.  Ambulation/Gait             General Gait Details: Deferred given poor standing balance.    Stairs             Wheelchair Mobility    Modified Rankin (Stroke Patients Only)       Balance Overall balance assessment: Needs assistance;History of Falls Sitting-balance support: Bilateral upper extremity supported;Feet supported Sitting balance-Leahy Scale: Poor   Postural control: Posterior lean                                  Cognition Arousal/Alertness: Suspect due to medications Behavior During Therapy: WFL for tasks assessed/performed Overall Cognitive Status: Impaired/Different from baseline Area of Impairment: Attention                   Current Attention Level: Focused           General Comments: According to pt's daughter, pt is much more lethargic and confused than normal.  Feels it is due to medication that was given to pt for tremors.  Exercises General Exercises - Lower Extremity Ankle Circles/Pumps: Left;10 reps;Supine Quad Sets: Right;10 reps;Supine Long Arc Quad: Right;Strengthening;10 reps;Seated Straight Leg Raises: Right;10 reps;Supine;AAROM Other Exercises Other Exercises: Time taken to educate pt's daughter regarding pain management and expectations regarding SNF x8 min    General Comments        Pertinent Vitals/Pain Pain Assessment: (Pt not able to give pain rating this treatment.) Faces Pain Scale: Hurts a little bit    Home Living                      Prior Function            PT Goals (current goals can now be found in the care plan section) Acute Rehab PT Goals Patient Stated Goal: To return to home mobility.  PT Goal Formulation: With patient Time For Goal  Achievement: 08/02/18 Potential to Achieve Goals: Good Progress towards PT goals: Not progressing toward goals - comment(Pt mobility limited due to cognitive status today.  )    Frequency    7X/week      PT Plan      Co-evaluation              AM-PAC PT "6 Clicks" Daily Activity  Outcome Measure  Difficulty turning over in bed (including adjusting bedclothes, sheets and blankets)?: A Little Difficulty moving from lying on back to sitting on the side of the bed? : A Little Difficulty sitting down on and standing up from a chair with arms (e.g., wheelchair, bedside commode, etc,.)?: A Lot Help needed moving to and from a bed to chair (including a wheelchair)?: Total Help needed walking in hospital room?: Total Help needed climbing 3-5 steps with a railing? : Total 6 Click Score: 11    End of Session Equipment Utilized During Treatment: Gait belt Activity Tolerance: Patient tolerated treatment well Patient left: in bed;with call bell/phone within reach Nurse Communication: Mobility status PT Visit Diagnosis: History of falling (Z91.81);Difficulty in walking, not elsewhere classified (R26.2)     Time: 3568-6168 PT Time Calculation (min) (ACUTE ONLY): 29 min  Charges:  $Therapeutic Exercise: 8-22 mins $Therapeutic Activity: 8-22 mins                     Roxanne Gates, PT, DPT    Roxanne Gates 07/20/2018, 5:28 PM

## 2018-07-20 NOTE — Clinical Social Work Note (Signed)
CSW spoke with patient's daughter [name], [phone] who was at bedside, she had questions regarding how insurance will pay for SNF placement and what other options if any are available.  CSW explained to her that [facility] is the only facility that offered a bed.  Patient's daughter asked about [facility], CSW informed her that they did not offer a bed.  Patient's daughter asked about ratings for facilities, CSW informed her that she can go to www.Medicare.gov to look at current ratings, CSW explained to patient's daughter what CMS looks at when given ratings to SNF.  CSW also expressed to daughter, that if she is not happy with how things are going at SNF, they can discuss either having her go back home or transfer to a different facility.  Patient's daughter did not have any other questions or concerns.  Patient's daughter was appreciative of information given and is still in agreement to having patient go to SNF pending insurance authorization.  Jones Broom. [place], MSW, [place]  07/20/2018 12:01 PM

## 2018-07-20 NOTE — ED Notes (Signed)
Dr Jimmye Norman notified of pt condition - he went to bedside assessed pt and discussed plan with daughter - splint to right ankle removed and area assessed (new splint to be placed)

## 2018-07-21 NOTE — ED Provider Notes (Signed)
-----------------------------------------   6:45 AM on 07/21/2018 -----------------------------------------   Blood pressure 122/61, pulse (!) 41, temperature 97.7 F (36.5 C), temperature source Oral, resp. rate 12, height 5\' 6"  (1.676 m), weight 65.8 kg, SpO2 (!) 86 %.  The patient had no acute events since last update.  Calm and cooperative at this time.  Disposition is pending placement with SW.    Darel Hong, MD 07/21/18 [phone]

## 2018-07-21 NOTE — ED Notes (Signed)
Patient resting in bed. Safety sitter at bedside. Given water to drink. No further needs expressed at this time.

## 2018-07-21 NOTE — Clinical Social Work Note (Addendum)
CSW received phone call that [facility] has received insurance authorization for patient to go to SNF today.  CSW to contact patient's family and inform them that patient has been approved.  Evette Cristal, MSW, [facility] ED Covering CSW [phone] 07/21/2018 1:51 PM

## 2018-07-21 NOTE — NC FL2 (Signed)
[place] LEVEL OF CARE SCREENING TOOL     IDENTIFICATION  Patient Name: Alyssa Holloway Birthdate: 03-07-1921 Sex: female Admission Date (Current Location): 07/19/2018  [facility] and [place] Number:  Engineering geologist and Address:  [facility], [address], [place], [place] [address]      Provider Number: [id number]  Attending Physician Name and Address:  No att. providers found  Relative Name and Phone Number:       Current Level of Care: Hospital Recommended Level of Care: [place] Prior Approval Number:    Date Approved/Denied:   PASRR Number: [id number]  Discharge Plan: SNF    Current Diagnoses: Patient Active Problem List   Diagnosis Date Noted  . Internal hemorrhoid 05/11/2014    Orientation RESPIRATION BLADDER Height & Weight     Self, Place, Situation, Time  Normal Continent Weight: 145 lb (65.8 kg) Height:  5\' 6"  (167.6 cm)  BEHAVIORAL SYMPTOMS/MOOD NEUROLOGICAL BOWEL NUTRITION STATUS  (none) (none) Continent Diet  AMBULATORY STATUS COMMUNICATION OF NEEDS Skin   Limited Assist Verbally Bruising                       Personal Care Assistance Level of Assistance  Bathing, Feeding, Dressing Bathing Assistance: Limited assistance Feeding assistance: Limited assistance Dressing Assistance: Limited assistance     Functional Limitations Info  Hearing, Sight, Speech Sight Info: Adequate Hearing Info: Impaired Speech Info: Adequate    SPECIAL CARE FACTORS FREQUENCY  PT (By licensed PT)     PT Frequency: 5x a week              Contractures Contractures Info: Not present    Additional Factors Info  Code Status, Allergies Code Status Info: Full Code             Current Medications (07/21/2018):  This is the current hospital active medication list Current Facility-Administered Medications  Medication Dose Route Frequency Provider Last Rate Last Dose  . amLODipine (NORVASC)  tablet 10 mg  10 mg Oral Daily Paulette Blanch, MD   10 mg at 07/20/18 1018  . aspirin chewable tablet 81 mg  81 mg Oral Daily Paulette Blanch, MD   81 mg at 07/21/18 0932  . benazepril (LOTENSIN) tablet 40 mg  40 mg Oral Daily Paulette Blanch, MD   40 mg at 07/20/18 1018  . levothyroxine (SYNTHROID, LEVOTHROID) tablet 125 mcg  125 mcg Oral Daily Paulette Blanch, MD   125 mcg at 07/21/18 0935  . metoprolol tartrate (LOPRESSOR) tablet 100 mg  100 mg Oral Daily Paulette Blanch, MD   100 mg at 07/20/18 1018  . traMADol (ULTRAM) tablet 50 mg  50 mg Oral Q4H PRN Paulette Blanch, MD   50 mg at 07/20/18 5621   Current Outpatient Medications  Medication Sig Dispense Refill  . amLODipine (NORVASC) 5 MG tablet Take 10 mg by mouth daily.     Marland Kitchen ascorbic acid (VITAMIN C) 1000 MG tablet Take 1,000 mg by mouth daily.    Mar[place] Kitchen aspirin 81 MG tablet Take 81 mg by mouth daily.    . benazepril (LOTENSIN) 20 MG tablet Take 40 mg by mouth daily.     . cholecalciferol (VITAMIN D) 1000 UNITS tablet Take 1,000 Units by mouth daily.    . furosemide (LASIX) 20 MG tablet Take 1 tablet by mouth daily as needed.    Marland Kitchen levothyroxine (SYNTHROID, LEVOTHROID) 125 MCG tablet Take  1 tablet by mouth daily.    Marland Kitchen lidocaine (XYLOCAINE) 5 % ointment Apply 1 application topically 3 (three) times daily as needed. 35.44 g 1  . lovastatin (MEVACOR) 20 MG tablet Take 1 tablet by mouth daily.    . metoprolol (LOPRESSOR) 100 MG tablet Take 1 tablet by mouth daily.    . Multiple Vitamin (MULTIVITAMIN) tablet Take 1 tablet by mouth daily.    . Omega-3 Fatty Acids (FISH OIL PO) Take by mouth.       Discharge Medications: Please see discharge summary for a list of discharge medications.  Relevant Imaging Results:  Relevant Lab Results:   Additional Information ss: [id number]  Ross Ludwig, LCSWA

## 2018-07-21 NOTE — Progress Notes (Signed)
Physical Therapy Treatment Patient Details Name: Alyssa Holloway MRN: [id number] DOB: 1920/11/28 Today's Date: 07/21/2018    History of Present Illness Patient is a pleasant 84 y/o female that presents with acute R ankle pain. She fell at home and sustained an acute mildly displaced medial malleolus and distal fibular fx. She is currently splinted and NWBing.     PT Comments    Pt agreeable to PT; reports moderate pain R ankle and groin. Pt participates well with supine exercises with assist as needed. Pt's daughter has questions regarding R groin pain/tightness. Educated in stretching. Pt now discharged to skilled nursing facility to continue rehab efforts.    Follow Up Recommendations  SNF     Equipment Recommendations       Recommendations for Other Services       Precautions / Restrictions Precautions Precautions: Fall Restrictions Weight Bearing Restrictions: Yes RLE Weight Bearing: Non weight bearing    Mobility  Bed Mobility Overal bed mobility: Needs Assistance Bed Mobility: Rolling Rolling: Mod assist         General bed mobility comments: Rolling for education and participation on hip flexor stretching  Transfers                    Ambulation/Gait                 Stairs             Wheelchair Mobility    Modified Rankin (Stroke Patients Only)       Balance                                            Cognition Arousal/Alertness: Awake/alert Behavior During Therapy: WFL for tasks assessed/performed Overall Cognitive Status: Within Functional Limits for tasks assessed                                        Exercises General Exercises - Lower Extremity Ankle Circles/Pumps: Left;AROM;20 reps;Supine Quad Sets: Strengthening;Both;20 reps;Supine Gluteal Sets: Strengthening;Both;20 reps;Supine Short Arc Quad: AROM;Both;20 reps;Supine Heel Slides: AROM;Both;20 reps;Supine(assist only to help  maintain off bed) Hip ABduction/ADduction: AAROM;Both;20 reps;Supine(primarily to hold LE off bed) Straight Leg Raises: AAROM;Both;10 reps;Supine(2 sets) Other Exercises Other Exercises: Pt/daughter educated participated in R hip flexor stretching. Educated on several ways    General Comments        Pertinent Vitals/Pain Pain Assessment: Faces Faces Pain Scale: Hurts even more Pain Location: R ankle also R groin Pain Intervention(s): Premedicated before session    Home Living                      Prior Function            PT Goals (current goals can now be found in the care plan section) Progress towards PT goals: Progressing toward goals    Frequency    7X/week      PT Plan Current plan remains appropriate    Co-evaluation              AM-PAC PT "6 Clicks" Daily Activity  Outcome Measure  Difficulty turning over in bed (including adjusting bedclothes, sheets and blankets)?: Unable Difficulty moving from lying on back to sitting on the side of the bed? : Unable Difficulty  sitting down on and standing up from a chair with arms (e.g., wheelchair, bedside commode, etc,.)?: Unable Help needed moving to and from a bed to chair (including a wheelchair)?: A Lot Help needed walking in hospital room?: Total Help needed climbing 3-5 steps with a railing? : Total 6 Click Score: 7    End of Session   Activity Tolerance: Patient tolerated treatment well Patient left: Other (comment);with nursing/sitter in room;with family/visitor present(pt now preparing to discharge)   PT Visit Diagnosis: History of falling (Z91.81);Difficulty in walking, not elsewhere classified (R26.2)     Time: 2202-5427 PT Time Calculation (min) (ACUTE ONLY): 35 min  Charges:  $Therapeutic Exercise: 23-37 mins                      Larae Grooms, PTA 07/21/2018, 4:59 PM

## 2018-07-21 NOTE — ED Notes (Signed)
Patient given breakfast tray. Sitting up in bed eating at this time. No further needs expressed. 

## 2018-07-21 NOTE — ED Notes (Signed)
Patient cleaned and brief changed. New linen on bed. No needs expressed at this time.

## 2018-07-21 NOTE — Clinical Social Work Note (Signed)
Patient to be d/c'ed today to [facility] room 1B.  Patient and family agreeable to plans will transport via ems RN to call report [phone].  Patient's daughter was at bedside and is aware of patient's discharge today.  Evette Cristal, MSW, [place]

## 2018-07-21 NOTE — ED Notes (Signed)
Patient's daughter at bedside with patient.

## 2018-08-12 ENCOUNTER — Encounter
Admission: RE | Admit: 2018-08-12 | Discharge: 2018-08-12 | Disposition: A | Discharge status: Home / Self Care | Payer: Medicare Other | Source: Ambulatory Visit | Attending: Specialist | Admitting: Specialist

## 2018-08-12 ENCOUNTER — Encounter: Payer: Self-pay | Admitting: *Deleted

## 2018-08-12 NOTE — Patient Instructions (Signed)
Your procedure is scheduled on:08/18/18 ARRIVAL TIME 6:00 AM Report to Day Surgery. MEDICAL MALL SECOND FLOOR   Remember: Instructions that are not followed completely may result in serious medical risk,  up to and including death, or upon the discretion of your surgeon and anesthesiologist your  surgery may need to be rescheduled.     _X__ 1. Do not eat food after midnight the night before your procedure.                 No gum chewing or hard candies. You may drink clear liquids up to 2 hours                 before you are scheduled to arrive for your surgery- DO not drink clear                 liquids within 2 hours of the start of your surgery.                 Clear Liquids include:  water, apple juice without pulp, clear carbohydrate                 drink such as Clearfast of Gatorade, Black Coffee or Tea (Do not add                 anything to coffee or tea).  __X__2.  On the morning of surgery brush your teeth with toothpaste and water, you                may rinse your mouth with mouthwash if you wish.  Do not swallow any toothpaste of mouthwash.     _X__ 3.  No Alcohol for 24 hours before or after surgery.   _X__ 4.  Do Not Smoke or use e-cigarettes For 24 Hours Prior to Your Surgery.                 Do not use any chewable tobacco products for at least 6 hours prior to                 surgery.  ____  5.  Bring all medications with you on the day of surgery if instructed.   _X___  6.  Notify your doctor if there is any change in your medical condition      (cold, fever, infections).     Do not wear jewelry, make-up, hairpins, clips or nail polish. Do not wear lotions, powders, or perfumes. You may wear deodorant. Do not shave 48 hours prior to surgery. Men may shave face and neck. Do not bring valuables to the hospital.    [facility] is not responsible for any belongings or valuables.  Contacts, dentures or bridgework may not be worn into  surgery. Leave your suitcase in the car. After surgery it may be brought to your room. For patients admitted to the hospital, discharge time is determined by your treatment team.   Patients discharged the day of surgery will not be allowed to drive home.     _X___ Take these medicines the morning of surgery with A SIP OF WATER:    1. AMLODIPINE  2. LEVOTHYROXINE  3. METOPROLOL  4.  5.  6.  ____ Fleet Enema (as directed)   ____ Use CHG Soap as directed  ____ Use inhalers on the day of surgery  ____ Stop metformin 2 days prior to surgery    ____ Take 1/2 of usual insulin dose the night  before surgery. No insulin the morning          of surgery.   _X___ Stop Coumadin/Plavix/aspirin on      STOP ASPIRIN LAST DOSE TO BE 08/12/18 ____ Stop Anti-inflammatories on    _X___ Stop supplements until after surgery.     STOP FISH OIL  LAST DOSE TO BE 08/12/18  ____ Bring C-Pap to the hospital.    [place] IF ANY QUESTIONS CALL [facility]  TESTING  [phone] AFTER Wednesday, IF ANY QUESTIONS  CALL SAME DAY SURGERY  [phone]  PLEASE FAX EKG TO [phone] AS SOON AS POSSIBLE  Kaelyn Innocent RN

## 2018-08-13 ENCOUNTER — Emergency Department: Payer: Medicare Other

## 2018-08-13 ENCOUNTER — Other Ambulatory Visit: Payer: Self-pay

## 2018-08-13 ENCOUNTER — Encounter: Payer: Self-pay | Admitting: Emergency Medicine

## 2018-08-13 ENCOUNTER — Other Ambulatory Visit: Payer: Self-pay | Admitting: Specialist

## 2018-08-13 ENCOUNTER — Observation Stay
Admission: EM | Admit: 2018-08-13 | Discharge: 2018-08-14 | Disposition: A | Discharge status: Skilled Nursing Facility | Payer: Medicare Other | Attending: Internal Medicine | Admitting: Internal Medicine

## 2018-08-13 DIAGNOSIS — Z79899 Other long term (current) drug therapy: Secondary | ICD-10-CM | POA: Insufficient documentation

## 2018-08-13 DIAGNOSIS — Z7982 Long term (current) use of aspirin: Secondary | ICD-10-CM | POA: Insufficient documentation

## 2018-08-13 DIAGNOSIS — R001 Bradycardia, unspecified: Principal | ICD-10-CM | POA: Insufficient documentation

## 2018-08-13 DIAGNOSIS — Z885 Allergy status to narcotic agent status: Secondary | ICD-10-CM | POA: Diagnosis not present

## 2018-08-13 DIAGNOSIS — Z79890 Hormone replacement therapy (postmenopausal): Secondary | ICD-10-CM | POA: Diagnosis not present

## 2018-08-13 DIAGNOSIS — R55 Syncope and collapse: Secondary | ICD-10-CM | POA: Insufficient documentation

## 2018-08-13 DIAGNOSIS — E119 Type 2 diabetes mellitus without complications: Secondary | ICD-10-CM | POA: Insufficient documentation

## 2018-08-13 DIAGNOSIS — E785 Hyperlipidemia, unspecified: Secondary | ICD-10-CM | POA: Insufficient documentation

## 2018-08-13 DIAGNOSIS — R1110 Vomiting, unspecified: Secondary | ICD-10-CM | POA: Diagnosis present

## 2018-08-13 DIAGNOSIS — I10 Essential (primary) hypertension: Secondary | ICD-10-CM | POA: Insufficient documentation

## 2018-08-13 LAB — CBC WITH DIFFERENTIAL/PLATELET
Abs Immature Granulocytes: 0.02 10*3/uL (ref 0.00–0.07)
BASOS PCT: 1 %
Basophils Absolute: 0 10*3/uL (ref 0.0–0.1)
Eosinophils Absolute: 0.2 10*3/uL (ref 0.0–0.5)
Eosinophils Relative: 4 %
HCT: 47.1 % — ABNORMAL HIGH (ref 36.0–46.0)
HEMOGLOBIN: 15.1 g/dL — AB (ref 12.0–15.0)
IMMATURE GRANULOCYTES: 0 %
LYMPHS PCT: 25 %
Lymphs Abs: 1.5 10*3/uL (ref 0.7–4.0)
MCH: 31.9 pg (ref 26.0–34.0)
MCHC: 32.1 g/dL (ref 30.0–36.0)
MCV: 99.6 fL (ref 80.0–100.0)
MONO ABS: 0.4 10*3/uL (ref 0.1–1.0)
MONOS PCT: 6 %
NEUTROS ABS: 3.7 10*3/uL (ref 1.7–7.7)
NEUTROS PCT: 64 %
PLATELETS: 147 10*3/uL — AB (ref 150–400)
RBC: 4.73 MIL/uL (ref 3.87–5.11)
RDW: 13.3 % (ref 11.5–15.5)
WBC: 5.8 10*3/uL (ref 4.0–10.5)
nRBC: 0 % (ref 0.0–0.2)

## 2018-08-13 LAB — URINALYSIS, COMPLETE (UACMP) WITH MICROSCOPIC
BACTERIA UA: NONE SEEN
BILIRUBIN URINE: NEGATIVE
Glucose, UA: NEGATIVE mg/dL
Hgb urine dipstick: NEGATIVE
KETONES UR: NEGATIVE mg/dL
Nitrite: NEGATIVE
PROTEIN: NEGATIVE mg/dL
Specific Gravity, Urine: 1.012 (ref 1.005–1.030)
pH: 6 (ref 5.0–8.0)

## 2018-08-13 LAB — HEMOGLOBIN A1C
Hgb A1c MFr Bld: 5.2 % (ref 4.8–5.6)
Mean Plasma Glucose: 102.54 mg/dL

## 2018-08-13 LAB — COMPREHENSIVE METABOLIC PANEL
ALBUMIN: 3.7 g/dL (ref 3.5–5.0)
ALT: 10 U/L (ref 0–44)
AST: 20 U/L (ref 15–41)
Alkaline Phosphatase: 68 U/L (ref 38–126)
Anion gap: 11 (ref 5–15)
BUN: 15 mg/dL (ref 8–23)
CALCIUM: 8.7 mg/dL — AB (ref 8.9–10.3)
CHLORIDE: 102 mmol/L (ref 98–111)
CO2: 27 mmol/L (ref 22–32)
CREATININE: 0.5 mg/dL (ref 0.44–1.00)
GFR calc Af Amer: >60 mL/min (ref >60)
GFR calc non Af Amer: >60 mL/min (ref >60)
GLUCOSE: 111 mg/dL — AB (ref 70–99)
Potassium: 3.6 mmol/L (ref 3.5–5.1)
Sodium: 140 mmol/L (ref 135–145)
Total Bilirubin: 1 mg/dL (ref 0.3–1.2)
Total Protein: 6.1 g/dL — ABNORMAL LOW (ref 6.5–8.1)

## 2018-08-13 LAB — LIPASE, BLOOD: Lipase: 51 U/L (ref 11–51)

## 2018-08-13 LAB — TROPONIN I

## 2018-08-13 LAB — TSH: TSH: 3.75 u[IU]/mL (ref 0.350–4.500)

## 2018-08-13 MED ORDER — ONDANSETRON HCL 4 MG/2ML IJ SOLN: 4.0000 mg | Freq: Four times a day (QID) | INTRAMUSCULAR | Status: DC | PRN

## 2018-08-13 MED ORDER — POLYETHYLENE GLYCOL 3350 17 G PO PACK: 17.0000 g | PACK | Freq: Every day | ORAL | Status: DC | PRN

## 2018-08-13 MED ORDER — ALBUTEROL SULFATE (2.5 MG/3ML) 0.083% IN NEBU: 2.5000 mg | INHALATION_SOLUTION | RESPIRATORY_TRACT | Status: DC | PRN

## 2018-08-13 MED ORDER — ENOXAPARIN SODIUM 40 MG/0.4ML ~~LOC~~ SOLN
40.0000 mg | SUBCUTANEOUS | Status: DC
  Administered 2018-08-13: 40 mg via SUBCUTANEOUS
  Filled 2018-08-13: qty 0.4

## 2018-08-13 MED ORDER — ACETAMINOPHEN 325 MG PO TABS: 650.0000 mg | ORAL_TABLET | Freq: Four times a day (QID) | ORAL | Status: DC | PRN

## 2018-08-13 MED ORDER — SODIUM CHLORIDE 0.9% FLUSH
3.0000 mL | Freq: Two times a day (BID) | INTRAVENOUS | Status: DC
  Administered 2018-08-13: 3 mL via INTRAVENOUS

## 2018-08-13 MED ORDER — ACETAMINOPHEN 650 MG RE SUPP: 650.0000 mg | Freq: Four times a day (QID) | RECTAL | Status: DC | PRN

## 2018-08-13 MED ORDER — ONDANSETRON HCL 4 MG PO TABS: 4.0000 mg | ORAL_TABLET | Freq: Four times a day (QID) | ORAL | Status: DC | PRN

## 2018-08-13 MED ORDER — INSULIN ASPART 100 UNIT/ML ~~LOC~~ SOLN: 0.0000 [IU] | Freq: Three times a day (TID) | SUBCUTANEOUS | Status: DC

## 2018-08-13 NOTE — ED Notes (Signed)
Patient transported to X-ray 

## 2018-08-13 NOTE — ED Triage Notes (Signed)
Pt to ED via EMS from [facility] with c/o nausea and vomiting today. PT A&Ox4 , VSS

## 2018-08-13 NOTE — H&P (Signed)
[place] [facility] [place] NAME: Alyssa Holloway    MR#:  [id number]  DATE OF BIRTH:  1921-07-12  DATE OF ADMISSION:  08/13/2018  PRIMARY CARE PHYSICIAN: Dion Body, MD   REQUESTING/REFERRING PHYSICIAN: Dr. Burlene Arnt  CHIEF COMPLAINT:   Chief Complaint  Patient presents with  . Emesis    HISTORY OF PRESENT ILLNESS:  Alyssa Holloway  is a 84 y.o. female with a known history of hypertension, dementia, hypothyroidism, recent right ankle fracture who is at [facility] presents due to 2 episodes of syncope followed by vomiting.  1 of these episodes was witnessed by the daughter who explains it as patient sitting in the wheelchair and saying she is not feeling well and getting dizzy and then threw her head back and rolled her eyes.  After this she returned to normal and threw up.  Presently patient feels back to normal.  Here in the emergency room patient has had episodes of bradycardia with pause of 1.8 seconds.  No episode of syncope here.  Patient is on metoprolol and Aricept at baseline.  Takes levothyroxine for her hypothyroidism.  TSH not available.  Patient is being admitted for overnight monitoring.  Does not have any cardiac history.  She is supposed to have ankle fracture surgery on Monday with Dr. Sabra Heck.  PAST MEDICAL HISTORY:   Past Medical History:  Diagnosis Date  . Anxiety   . Dementia ([place])    [place]  . Diabetes mellitus without complication (HCC)    DIET CONTROLLED  . Fibrocystic breast disease   . Hemorrhoid   . Hyperlipidemia   . Hypertension   . Hypothyroidism   . Malignant neoplasm of upper-outer quadrant of female breast ([place]) August 25, 2003   Invasive ductal carcinoma, T2, N0.   ER/PR negative, HER-2/neu 3+.  . Rectal bleeding   . Thrombocytopenia ([place])   . Thyroid disease     PAST SURGICAL HISTORY:   Past Surgical History:  Procedure Laterality Date  . APPENDECTOMY    . BREAST SURGERY  Right    lumpectomy  . CATARACT EXTRACTION W/PHACO Left 03/21/2017   Procedure: CATARACT EXTRACTION PHACO AND INTRAOCULAR LENS PLACEMENT (IOC);  Surgeon: Leandrew Koyanagi, MD;  Location: [facility] ORS;  Service: Ophthalmology;  Laterality: Left;  [place] 1:38.9 AP% 24.4 CDE 24.10 Fluid pack lot # [id number]  . HEMORRHOID BANDING  August 2015  . STAPLE HEMORRHOIDECTOMY  April 09, 2006  . THYROID SURGERY      SOCIAL HISTORY:   Social History   Tobacco Use  . Smoking status: Never Smoker  . Smokeless tobacco: Never Used  Substance Use Topics  . Alcohol use: Yes    Comment: GLASS OF WINE ON THURSDAYS    FAMILY HISTORY:  No family history on file.  DRUG ALLERGIES:   Allergies  Allergen Reactions  . Vicodin [Hydrocodone-Acetaminophen]     REVIEW OF SYSTEMS:   Review of Systems  Unable to perform ROS: Dementia    MEDICATIONS AT HOME:   Prior to Admission medications   Medication Sig Start Date End Date Taking? Authorizing Provider  acetaminophen (TYLENOL) 500 MG tablet Take 1,000 mg by mouth every 8 (eight) hours.   Yes [provider]  amLODipine (NORVASC) 5 MG tablet Take 10 mg by mouth daily.    Yes [provider]  ascorbic acid (VITAMIN C) 1000 MG tablet Take 1,000 mg by mouth daily.   Yes [provider]  aspirin 81 MG tablet  Take 81 mg by mouth daily.   Yes [provider]  benazepril (LOTENSIN) 20 MG tablet Take 20 mg by mouth daily.    Yes [provider]  cholecalciferol (VITAMIN D) 1000 UNITS tablet Take 1,000 Units by mouth daily.   Yes [provider]  donepezil (ARICEPT) 10 MG tablet Take 10 mg by mouth at bedtime.   Yes [provider]  furosemide (LASIX) 20 MG tablet Take 1 tablet by mouth daily.  03/22/14  Yes [provider]  levothyroxine (SYNTHROID, LEVOTHROID) 125 MCG tablet Take 112 mcg by mouth daily.  03/22/14  Yes [provider]  lidocaine (XYLOCAINE) 5 % ointment Apply 1  application topically 3 (three) times daily as needed. 05/26/14  Yes Byrnett, Forest Gleason, MD  metoprolol succinate (TOPROL-XL) 50 MG 24 hr tablet Take 50 mg by mouth daily.   Yes [provider]  Multiple Vitamin (MULTIVITAMIN) tablet Take 1 tablet by mouth daily.   Yes [provider]  Omega-3 Fatty Acids (FISH OIL) 1000 MG CAPS Take 1,000 mg by mouth daily.    Yes [provider]     VITAL SIGNS:  Blood pressure 111/81, pulse 72, temperature 97.9 F (36.6 C), temperature source Oral, resp. rate 13, SpO2 95 %.  PHYSICAL EXAMINATION:  Physical Exam  GENERAL:  84 y.o.-year-old patient lying in the bed with no acute distress.  EYES: Pupils equal, round, reactive to light and accommodation. No scleral icterus. Extraocular muscles intact.  HEENT: Head atraumatic, normocephalic. Oropharynx and nasopharynx clear. No oropharyngeal erythema, moist oral mucosa  NECK:  Supple, no jugular venous distention. No thyroid enlargement, no tenderness.  LUNGS: Normal breath sounds bilaterally, no wheezing, rales, rhonchi. No use of accessory muscles of respiration.  CARDIOVASCULAR: S1, S2 normal. No murmurs, rubs, or gallops.  ABDOMEN: Soft, nontender, nondistended. Bowel sounds present. No organomegaly or mass.  EXTREMITIES: No pedal edema, cyanosis, or clubbing.  Cast over right leg and foot NEUROLOGIC: Cranial nerves II through XII are intact. No focal Motor or sensory deficits appreciated b/l PSYCHIATRIC: The patient is alert and awake.  Pleasantly confused SKIN: No obvious rash, lesion, or ulcer.   LABORATORY PANEL:   CBC Recent Labs  Lab 08/13/18 1528  WBC 5.8  HGB 15.1*  HCT 47.1*  PLT 147*   ------------------------------------------------------------------------------------------------------------------  Chemistries  Recent Labs  Lab 08/13/18 1528  NA 140  K 3.6  CL 102  CO2 27  GLUCOSE 111*  BUN 15  CREATININE 0.50  CALCIUM 8.7*  AST 20  ALT 10   ALKPHOS 68  BILITOT 1.0   ------------------------------------------------------------------------------------------------------------------  Cardiac Enzymes Recent Labs  Lab 08/13/18 1528  TROPONINI <0.03   ------------------------------------------------------------------------------------------------------------------  RADIOLOGY:  Dg Chest 2 View  Result Date: 08/13/2018 CLINICAL DATA:  Vomiting, breast cancer EXAM: CHEST - 2 VIEW COMPARISON:  07/20/2018 chest radiograph. FINDINGS: Stable cardiomediastinal silhouette with top-normal heart size. No pneumothorax. No pleural effusion. Stable tiny peripheral left upper lung granuloma. No pulmonary edema. No acute consolidative airspace disease. Surgical clips are noted in the right axilla. IMPRESSION: No active cardiopulmonary disease. Electronically Signed   By: Ilona Sorrel M.D.   On: 08/13/2018 15:57   IMPRESSION AND PLAN:   *Syncope likely due to bradycardia.  Presently in sinus rhythm.  Will stop patient's metoprolol.  If this does not help may also need to stop Aricept.  Check TSH levels. Telemetry monitoring. We will consult cardiology as patient has ankle surgery on Monday and anesthesia would likely want their  input.  *Hypertension.  Continue patient's [place].  Hold metoprolol.  *Dementia.  Monitor for inpatient delirium.  *Recent right ankle fracture.  Patient has surgery scheduled for Monday.  DVT prophylaxis with Lovenox  All the records are reviewed and case discussed with ED provider. Management plans discussed with the patient, family and they are in agreement.  CODE STATUS: DO NOT RESUSCITATE  TOTAL TIME TAKING CARE OF THIS PATIENT: 40 minutes.   Neita Carp M.D on 08/13/2018 at 7:13 PM  Between 7am to 6pm - Pager - [phone]  After 6pm go to [email or web address] - password [email or web address] [facility]  Office  [phone]  CC: Primary care physician; Dion Body, MD  Note: This  dictation was prepared with Dragon dictation along with smaller phrase technology. Any transcriptional errors that result from this process are unintentional.

## 2018-08-13 NOTE — ED Notes (Signed)
Pt stated she was able to urinate to collect sample. Stool noted in diaper, pt cleaned and peri care provided. Pt placed on bedpan and was able to provide a urine sample. Pt cleaned, clean diaper placed and patient assisted to position of comfort, Pt resting comfortably.

## 2018-08-13 NOTE — ED Provider Notes (Signed)
[facility] Emergency Department Provider Note  ____________________________________________   I have reviewed the triage vital signs and the nursing notes. Where available I have reviewed prior notes and, if possible and indicated, outside hospital notes.    HISTORY  Chief Complaint Emesis    HPI Janyiah B Dais is a 84 y.o. female who presents today complaining of vomiting initially other when the family got to the states that they were stressed a syncopal event she did not fall or hit her head, she had a similar event several days ago.  Family states that she was sitting in the chair stated that she did not feel well, rolled her head back and was unconscious for a brief moment then came out of it no postictal.  No actual seizure activity noted.  According to family this happened once a couple days ago at the rehab facility as well.  She has not had this before.  All of this history is after EMS came in the initial work-up was performed.  According them it was 2 episodes of vomiting.  That is all patient recollected initially as well.  Daughter states that she did vomit after this event.  Is at her baseline at this time.  Cannot give a very good history because of history of dementia.  Level 5 chart caveat; no further history available due to patient status.   Past Medical History:  Diagnosis Date  . Anxiety   . Dementia ([place])    ALZHEIMERS  . Diabetes mellitus without complication (HCC)    DIET CONTROLLED  . Fibrocystic breast disease   . Hemorrhoid   . Hyperlipidemia   . Hypertension   . Hypothyroidism   . Malignant neoplasm of upper-outer quadrant of female breast ([place]) August 25, 2003   Invasive ductal carcinoma, T2, N0.   ER/PR negative, HER-2/neu 3+.  . Rectal bleeding   . Thrombocytopenia ([place])   . Thyroid disease     Patient Active Problem List   Diagnosis Date Noted  . Internal hemorrhoid 05/11/2014    Past Surgical History:  Procedure  Laterality Date  . APPENDECTOMY    . BREAST SURGERY Right    lumpectomy  . CATARACT EXTRACTION W/PHACO Left 03/21/2017   Procedure: CATARACT EXTRACTION PHACO AND INTRAOCULAR LENS PLACEMENT (IOC);  Surgeon: Leandrew Koyanagi, MD;  Location: [facility] ORS;  Service: Ophthalmology;  Laterality: Left;  [place] 1:38.9 AP% 24.4 CDE 24.10 Fluid pack lot # [id number]  . HEMORRHOID BANDING  August 2015  . STAPLE HEMORRHOIDECTOMY  April 09, 2006  . THYROID SURGERY      Prior to Admission medications   Medication Sig Start Date End Date Taking? Authorizing Provider  Acetaminophen (TYLENOL 8 HOUR PO) Take 1,000 mg by mouth 3 (three) times daily.    [provider]  amLODipine (NORVASC) 5 MG tablet Take 10 mg by mouth daily.     [provider]  ascorbic acid (VITAMIN C) 1000 MG tablet Take 1,000 mg by mouth daily.    [provider]  aspirin 81 MG tablet Take 81 mg by mouth daily.    [provider]  benazepril (LOTENSIN) 20 MG tablet Take 40 mg by mouth daily.     [provider]  cholecalciferol (VITAMIN D) 1000 UNITS tablet Take 1,000 Units by mouth daily.    [provider]  donepezil (ARICEPT) 10 MG tablet Take 10 mg by mouth at bedtime.    [provider]  furosemide (LASIX) 20 MG tablet Take  1 tablet by mouth daily.  03/22/14   [provider]  levothyroxine (SYNTHROID, LEVOTHROID) 125 MCG tablet Take 112 mcg by mouth daily.  03/22/14   [provider]  lidocaine (XYLOCAINE) 5 % ointment Apply 1 application topically 3 (three) times daily as needed. 05/26/14   Robert Bellow, MD  lovastatin (MEVACOR) 20 MG tablet Take 1 tablet by mouth daily. 02/20/14   [provider]  metoprolol (LOPRESSOR) 100 MG tablet Take 1 tablet by mouth daily. 03/22/14   [provider]  Multiple Vitamin (MULTIVITAMIN) tablet Take 1 tablet by mouth daily.    [provider]  Omega-3 Fatty Acids (FISH OIL PO) Take by mouth.     [provider]    Allergies Vicodin [hydrocodone-acetaminophen]  No family history on file.  Social History Social History   Tobacco Use  . Smoking status: Never Smoker  . Smokeless tobacco: Never Used  Substance Use Topics  . Alcohol use: Yes    Comment: GLASS OF WINE ON THURSDAYS  . Drug use: No    Review of Systems Constitutional: No fever/chills Eyes: No visual changes. ENT: No sore throat. No stiff neck no neck pain Cardiovascular: Denies chest pain. Respiratory: Denies shortness of breath. Gastrointestinal:   + vomiting.  No diarrhea.  No constipation. Genitourinary: Negative for dysuria. Musculoskeletal: Negative lower extremity swelling Skin: Negative for rash. Neurological: Negative for severe headaches, focal weakness or numbness.   ____________________________________________   PHYSICAL EXAM:  VITAL SIGNS: ED Triage Vitals  Enc Vitals Group     BP 08/13/18 1527 (!) 147/100     Pulse Rate 08/13/18 1525 75     Resp 08/13/18 1525 14     Temp 08/13/18 1525 97.9 F (36.6 C)     Temp Source 08/13/18 1525 Oral     SpO2 08/13/18 1525 98 %     Weight --      Height --      Head Circumference --      Peak Flow --      Pain Score 08/13/18 1526 0     Pain Loc --      Pain Edu? --      Excl. in [place]? --     Constitutional: Alert and oriented. Well appearing and in no acute distress. Eyes: Conjunctivae are normal Head: Atraumatic HEENT: No congestion/rhinnorhea. Mucous membranes are moist.  Oropharynx non-erythematous Neck:   Nontender with no meningismus, no masses, no stridor Cardiovascular: Normal rate, regular rhythm. Grossly normal heart sounds.  Good peripheral circulation. Respiratory: Normal respiratory effort.  No retractions. Lungs CTAB. Abdominal: Soft and nontender. No distention. No guarding no rebound Back:  There is no focal tenderness or step off.  there is no midline tenderness there are no lesions noted. there is no CVA  tenderness  Musculoskeletal: No lower extremity tenderness, no upper extremity tenderness. No joint effusions, no DVT signs strong distal pulses no edema Neurologic:  Normal speech and language. No gross focal neurologic deficits are appreciated.  Skin:  Skin is warm, dry and intact. No rash noted. Psychiatric: Mood and affect are normal. Speech and behavior are normal.  ____________________________________________   LABS (all labs ordered are listed, but only abnormal results are displayed)  Labs Reviewed  CBC WITH DIFFERENTIAL/PLATELET - Abnormal; Notable for the following components:      Result Value   Hemoglobin 15.1 (*)    HCT 47.1 (*)    Platelets 147 (*)    All other components within  normal limits  TROPONIN I  COMPREHENSIVE METABOLIC PANEL  LIPASE, BLOOD  URINALYSIS, COMPLETE (UACMP) WITH MICROSCOPIC    Pertinent labs  results that were available during my care of the patient were reviewed by me and considered in my medical decision making (see chart for details). ____________________________________________  EKG  I personally interpreted any EKGs ordered by me or triage Likely sinus rhythm, rate 98 bpm, no acute ST elevation or depression nonspecific ST changes baseline artifact limits interpretation ____________________________________________  RADIOLOGY  Pertinent labs & imaging results that were available during my care of the patient were reviewed by me and considered in my medical decision making (see chart for details). If possible, patient and/or family made aware of any abnormal findings.  Dg Chest 2 View  Result Date: 08/13/2018 CLINICAL DATA:  Vomiting, breast cancer EXAM: CHEST - 2 VIEW COMPARISON:  07/20/2018 chest radiograph. FINDINGS: Stable cardiomediastinal silhouette with top-normal heart size. No pneumothorax. No pleural effusion. Stable tiny peripheral left upper lung granuloma. No pulmonary edema. No acute consolidative airspace disease. Surgical  clips are noted in the right axilla. IMPRESSION: No active cardiopulmonary disease. Electronically Signed   By: Ilona Sorrel M.D.   On: 08/13/2018 15:57   ____________________________________________    PROCEDURES  Procedure(s) performed: None  Procedures  Critical Care performed: CRITICAL CARE Performed by: Schuyler Amor   Total critical care time: 36 minutes  Critical care time was exclusive of separately billable procedures and treating other patients.  Critical care was necessary to treat or prevent imminent or life-threatening deterioration.  Critical care was time spent personally by me on the following activities: development of treatment plan with patient and/or surrogate as well as nursing, discussions with consultants, evaluation of patient's response to treatment, examination of patient, obtaining history from patient or surrogate, ordering and performing treatments and interventions, ordering and review of laboratory studies, ordering and review of radiographic studies, pulse oximetry and re-evaluation of patient's condition.   ____________________________________________   INITIAL IMPRESSION / ASSESSMENT AND PLAN / ED COURSE  Pertinent labs & imaging results that were available during my care of the patient were reviewed by me and considered in my medical decision making (see chart for details).  Patient with syncopal event today, work-up is reassuring however on the monitor there are multiple sinus pauses of a few seconds and it would not surprise me if this had lasted longer she could have passed out.  I think therefore for the possibility of cardiogenic syncope we will admit her for observation    ____________________________________________   FINAL CLINICAL IMPRESSION(S) / ED DIAGNOSES  Final diagnoses:  None      This chart was dictated using voice recognition software.  Despite best efforts to proofread,  errors can occur which can change meaning.       Schuyler Amor, MD 08/13/18 [phone]

## 2018-08-13 NOTE — Progress Notes (Signed)
Advance care planning  Purpose of Encounter Syncope, CODE STATUS discussion  Parties in Attendance Patient and daughter at bedside  Patients Decisional capacity Patient has dementia and unable to make medical decisions.  Healthcare power of attorney is her daughter.  Has documented advanced directives.   Discussed regarding patient's possible bradycardia causing syncope.  Prognosis and treatment discussed with daughter.  All questions answered  We discussed regarding patient's CODE STATUS and daughter tells me that patient is a DO NOT RESUSCITATE and not intubate.  Orders entered and CODE STATUS changed in the computer.  DNR  Time spent - 17 minutes

## 2018-08-14 LAB — CBC
HEMATOCRIT: 42.7 % (ref 36.0–46.0)
HEMOGLOBIN: 14.1 g/dL (ref 12.0–15.0)
MCH: 32 pg (ref 26.0–34.0)
MCHC: 33 g/dL (ref 30.0–36.0)
MCV: 96.8 fL (ref 80.0–100.0)
Platelets: 157 10*3/uL (ref 150–400)
RBC: 4.41 MIL/uL (ref 3.87–5.11)
RDW: 13.5 % (ref 11.5–15.5)
WBC: 4.7 10*3/uL (ref 4.0–10.5)
nRBC: 0 % (ref 0.0–0.2)

## 2018-08-14 LAB — BASIC METABOLIC PANEL
ANION GAP: 9 (ref 5–15)
BUN: 13 mg/dL (ref 8–23)
CHLORIDE: 104 mmol/L (ref 98–111)
CO2: 30 mmol/L (ref 22–32)
Calcium: 8.7 mg/dL — ABNORMAL LOW (ref 8.9–10.3)
Creatinine, Ser: 0.55 mg/dL (ref 0.44–1.00)
GFR calc Af Amer: >60 mL/min (ref >60)
GFR calc non Af Amer: >60 mL/min (ref >60)
GLUCOSE: 95 mg/dL (ref 70–99)
POTASSIUM: 3.1 mmol/L — AB (ref 3.5–5.1)
Sodium: 143 mmol/L (ref 135–145)

## 2018-08-14 LAB — PROTIME-INR
INR: 1.01
PROTHROMBIN TIME: 13.2 s (ref 11.4–15.2)

## 2018-08-14 LAB — GLUCOSE, CAPILLARY
GLUCOSE-CAPILLARY: 82 mg/dL (ref 70–99)
Glucose-Capillary: 89 mg/dL (ref 70–99)

## 2018-08-14 MED ORDER — GUAIFENESIN-DM 100-10 MG/5ML PO SYRP: 5.0000 mL | ORAL_SOLUTION | ORAL | Status: DC | PRN

## 2018-08-14 MED ORDER — POTASSIUM CHLORIDE CRYS ER 20 MEQ PO TBCR
40.0000 meq | EXTENDED_RELEASE_TABLET | Freq: Once | ORAL | Status: AC
  Administered 2018-08-14: 40 meq via ORAL
  Filled 2018-08-14: qty 2

## 2018-08-14 NOTE — Care Management Obs Status (Signed)
[facility] NOTIFICATION   Patient Details  Name: Alyssa Holloway MRN: [id number] Date of Birth: January 14, 1921   Medicare Observation Status Notification Given:  Yes   Telephone reviewed with daughter [name]. Noted by X on form.   Copy left in room    Beverly Sessions, RN 08/14/2018, 10:11 AM

## 2018-08-14 NOTE — Clinical Social Work Note (Signed)
Clinical Social Work Assessment  Patient Details  Name: Alyssa Holloway MRN: [id number] Date of Birth: 02-Mar-1921  Date of referral:  08/14/18               Reason for consult:  Facility Placement                Permission sought to share information with:  Facility Sport and exercise psychologist, Family Supports Permission granted to share information::  Yes, Verbal Permission Granted  Name::     [place] Daughter [phone]  [phone] or [place], [place]   Agency::  SNF admissions  Relationship::     Contact Information:     Housing/Transportation Living arrangements for the past 2 months:  [place], [place] of Information:  Patient, Adult Children Patient Interpreter Needed:  None Criminal Activity/Legal Involvement Pertinent to Current Situation/Hospitalization:  No - Comment as needed Significant Relationships:  Adult Children Lives with:  Self Do you feel safe going back to the place where you live?  No Need for family participation in patient care:  Yes (Comment)  Care giving concerns:  Patient and family did not express any concerns about returning back to [facility].  Social Worker assessment / plan:  Patient is a 84 year old female who is alert and oriented x3.  Patient is a short term rehab at [facility].  Patient has been there about 2.5 weeks, patient states things are going ok there.  Patient was sitting up in her bed eating her lunch when CSW was speaking to her.  Patient stated she was not sure what brought her to the hospital, all she remembered is that she felt dizzy and then came to hospital.  Patient expressed that she has been participating with therapy and she feels like she is making progress.  Patients daughter was also in agreement that she is progressing.  Patient and daughter did not have any other questions or concerns.  Employment status:  Retired Office manager PT Recommendations:  [place] / Referral to community resources:  [place]  Patient/Family's Response to care:  Patient and family are agreeable with her planning to return back to [facility].  Patient/Family's Understanding of and Emotional Response to Diagnosis, Current Treatment, and Prognosis: Patient is hopeful that she will continue to make progress.  Emotional Assessment Appearance:  Appears stated age Attitude/Demeanor/Rapport:    Affect (typically observed):  Appropriate, Pleasant, Calm Orientation:  Oriented to Self, Oriented to Place Alcohol / Substance use:  Not Applicable Psych involvement (Current and /or in the community):  No (Comment)  Discharge Needs  Concerns to be addressed:  Cognitive Concerns, Care Coordination Readmission within the last 30 days:  Yes(07-21-18 to [facility] SNF) Current discharge risk:  None Barriers to Discharge:  No Barriers Identified   Ross Ludwig, LCSWA 08/14/2018, 2:02 PM

## 2018-08-14 NOTE — Progress Notes (Signed)
Pt to be discharged back to [facility] this afternoon. Report called to jennifer at the facility. Iv and tele removed. Awaiting ems for transport.

## 2018-08-14 NOTE — Care Management (Signed)
Patient resides at [facility] center for STR.  Per Daughter prior to admission at [facility] patient lived alone at her address [address] [place].  Daughter wishes for patient to return to [facility] at discharge.  Per daughter patient has a scheduled surgery planned for Monday.  CSW notified

## 2018-08-14 NOTE — Clinical Social Work Note (Signed)
Patient to be d/c'ed today to [facility] room 1B.  Patient and family agreeable to plans will transport via ems RN to call report to [phone].  CSW spoke with patient's daughter Hilda Blades who is aware that patient is discharging today.  Evette Cristal, MSW, [place]

## 2018-08-14 NOTE — Discharge Summary (Signed)
[facility] NAME: Alyssa Holloway    MR#:  [id number]  DATE OF BIRTH:  [birth year]  DATE OF ADMISSION:  08/13/2018 ADMITTING PHYSICIAN: Hillary Bow, MD  DATE OF DISCHARGE: 08/14/2018  PRIMARY CARE PHYSICIAN: Dion Body, MD   ADMISSION DIAGNOSIS:  Syncope, unspecified syncope type [R55]  DISCHARGE DIAGNOSIS:  Active Problems:   Bradycardia  SECONDARY DIAGNOSIS:   Past Medical History:  Diagnosis Date  . Anxiety   . Dementia ([place])    ALZHEIMERS  . Diabetes mellitus without complication (HCC)    DIET CONTROLLED  . Fibrocystic breast disease   . Hemorrhoid   . Hyperlipidemia   . Hypertension   . Hypothyroidism   . Malignant neoplasm of upper-outer quadrant of female breast ([place]) August 25, 2003   Invasive ductal carcinoma, T2, N0.   ER/PR negative, HER-2/neu 3+.  . Rectal bleeding   . Thrombocytopenia ([place])   . Thyroid disease      ADMITTING HISTORY  HISTORY OF PRESENT ILLNESS:  Alyssa Holloway  is a 84 y.o. female with a known history of hypertension, dementia, hypothyroidism, recent right ankle fracture who is at [facility] rehab center presents due to 2 episodes of syncope followed by vomiting.  1 of these episodes was witnessed by the daughter who explains it as patient sitting in the wheelchair and saying she is not feeling well and getting dizzy and then threw her head back and rolled her eyes.  After this she returned to normal and threw up.  Presently patient feels back to normal.  Here in the emergency room patient has had episodes of bradycardia with pause of 1.8 seconds.  No episode of syncope here.  Patient is on metoprolol and Aricept at baseline.  Takes levothyroxine for her hypothyroidism.  TSH not available.  Patient is being admitted for overnight monitoring.  Does not have any cardiac history.  She is supposed to have ankle fracture surgery on Monday with Dr. Sabra Heck.   HOSPITAL COURSE:    *Syncope likely due to bradycardia.  Patient in normal sinus rhythm in the hospital.  Episodes of heart rate going into the high 40s but no symptoms of syncope.  Pause of 1.8 seconds in the emergency room.  Her metoprolol has been stopped.  Cardiology Dr. call Clydene Laming has evaluated and agrees with the plan of holding metoprolol.  Patient is okay to have ankle surgery per cardiology on Monday.  *Dementia stable  *Hypertension.  Metoprolol held.  Continue other home medications.  Patient is stable for discharge back to her assisted living facility.  CONSULTS OBTAINED:  Treatment Team:  Earnestine Leys, MD Teodoro Spray, MD Yolonda Kida, MD  DRUG ALLERGIES:   Allergies  Allergen Reactions  . Vicodin [Hydrocodone-Acetaminophen]     DISCHARGE MEDICATIONS:   Allergies as of 08/14/2018      Reactions   Vicodin [hydrocodone-acetaminophen]       Medication List    STOP taking these medications   metoprolol succinate 50 MG 24 hr tablet Commonly known as:  TOPROL-XL     TAKE these medications   acetaminophen 500 MG tablet Commonly known as:  TYLENOL Take 1,000 mg by mouth every 8 (eight) hours.   amLODipine 5 MG tablet Commonly known as:  NORVASC Take 10 mg by mouth daily.   ascorbic acid 1000 MG tablet Commonly known as:  VITAMIN C Take 1,000 mg by mouth daily.   aspirin 81 MG tablet Take 81 mg  by mouth daily.   benazepril 20 MG tablet Commonly known as:  LOTENSIN Take 20 mg by mouth daily.   cholecalciferol 1000 units tablet Commonly known as:  VITAMIN D Take 1,000 Units by mouth daily.   donepezil 10 MG tablet Commonly known as:  ARICEPT Take 10 mg by mouth at bedtime.   Fish Oil 1000 MG Caps Take 1,000 mg by mouth daily.   furosemide 20 MG tablet Commonly known as:  LASIX Take 1 tablet by mouth daily.   levothyroxine 125 MCG tablet Commonly known as:  SYNTHROID, LEVOTHROID Take 112 mcg by mouth daily.   lidocaine 5 % ointment Commonly known  as:  XYLOCAINE Apply 1 application topically 3 (three) times daily as needed.   multivitamin tablet Take 1 tablet by mouth daily.       Today   VITAL SIGNS:  Blood pressure 139/78, pulse 66, temperature 97.8 F (36.6 C), temperature source Oral, resp. rate 18, height _0  (1.651 m), weight 59.6 kg, SpO2 97 %.  I/O:    Intake/Output Summary (Last 24 hours) at 08/14/2018 1041 Last data filed at 08/13/2018 2107 Gross per 24 hour  Intake 3 ml  Output -  Net 3 ml    PHYSICAL EXAMINATION:  Physical Exam  GENERAL:  84 y.o.-year-old patient lying in the bed with no acute distress.  LUNGS: Normal breath sounds bilaterally, no wheezing, rales,rhonchi or crepitation. No use of accessory muscles of respiration.  CARDIOVASCULAR: S1, S2 normal. No murmurs, rubs, or gallops.  ABDOMEN: Soft, non-tender, non-distended. Bowel sounds present. No organomegaly or mass.  NEUROLOGIC: Moves all 4 extremities. PSYCHIATRIC: The patient is alert and awake.  Pleasantly confused SKIN: No obvious rash, lesion, or ulcer.   DATA REVIEW:   CBC Recent Labs  Lab 08/14/18 0326  WBC 4.7  HGB 14.1  HCT 42.7  PLT 157    Chemistries  Recent Labs  Lab 08/13/18 1528 08/14/18 0326  NA 140 143  K 3.6 3.1*  CL 102 104  CO2 27 30  GLUCOSE 111* 95  BUN 15 13  CREATININE 0.50 0.55  CALCIUM 8.7* 8.7*  AST 20  --   ALT 10  --   ALKPHOS 68  --   BILITOT 1.0  --     Cardiac Enzymes Recent Labs  Lab 08/13/18 1528  TROPONINI <0.03    Microbiology Results  No results found for this or any previous visit.  RADIOLOGY:  Dg Chest 2 View  Result Date: 08/13/2018 CLINICAL DATA:  Vomiting, breast cancer EXAM: CHEST - 2 VIEW COMPARISON:  07/20/2018 chest radiograph. FINDINGS: Stable cardiomediastinal silhouette with top-normal heart size. No pneumothorax. No pleural effusion. Stable tiny peripheral left upper lung granuloma. No pulmonary edema. No acute consolidative airspace disease. Surgical  clips are noted in the right axilla. IMPRESSION: No active cardiopulmonary disease. Electronically Signed   By: Ilona Sorrel M.D.   On: 08/13/2018 15:57    Follow up with PCP in 1 week.  Management plans discussed with the patient, family and they are in agreement.  CODE STATUS:     Code Status Orders  (From admission, onward)         Start     Ordered   08/13/18 1907  Do not attempt resuscitation (DNR)  Continuous    Question Answer Comment  In the event of cardiac or respiratory ARREST Do not call a "code blue"   In the event of cardiac or respiratory ARREST Do not perform Intubation, CPR, defibrillation  or ACLS   In the event of cardiac or respiratory ARREST Use medication by any route, position, wound care, and other measures to relive pain and suffering. May use oxygen, suction and manual treatment of airway obstruction as needed for comfort.      08/13/18 1907        Code Status History    This patient has a current code status but no historical code status.      TOTAL TIME TAKING CARE OF THIS PATIENT ON DAY OF DISCHARGE: more than 30 minutes.   Neita Carp M.D on 08/14/2018 at 10:41 AM  Between 7am to 6pm - Pager - [phone]  After 6pm go to [email or web address] - password [email or web address] [place] [facility]  Office  [phone]  CC: Primary care physician; Dion Body, MD  Note: This dictation was prepared with Dragon dictation along with smaller phrase technology. Any transcriptional errors that result from this process are unintentional.

## 2018-08-14 NOTE — Consult Note (Signed)
Reason for Consult: Bradycardia preoperative clearance Referring Physician: Dr. Netty Starring primary   Alyssa Holloway is an 84 y.o. female.  HPI: Patient history of hypertension dementia hypothyroidism recently [facility] patient scheduled for corrective surgery on Monday.  Patient reportedly had an episode of nausea and vomiting.  Patient also had episodes of bradycardia and short pauses denies any chest pain has been maintained on metoprolol and BenzePrO for blood pressure management.  Patient has some dementia so history is very difficult  Past Medical History:  Diagnosis Date  . Anxiety   . Dementia ([place])    [place]ERS  . Diabetes mellitus without complication (HCC)    DIET CONTROLLED  . Fibrocystic breast disease   . Hemorrhoid   . Hyperlipidemia   . Hypertension   . Hypothyroidism   . Malignant neoplasm of upper-outer quadrant of female breast ([place]) August 25, 2003   Invasive ductal carcinoma, T2, N0.   ER/PR negative, HER-2/neu 3+.  . Rectal bleeding   . Thrombocytopenia ([place])   . Thyroid disease     Past Surgical History:  Procedure Laterality Date  . APPENDECTOMY    . BREAST SURGERY Right    lumpectomy  . CATARACT EXTRACTION W/PHACO Left 03/21/2017   Procedure: CATARACT EXTRACTION PHACO AND INTRAOCULAR LENS PLACEMENT (IOC);  Surgeon: Leandrew Koyanagi, MD;  Location: [facility] ORS;  Service: Ophthalmology;  Laterality: Left;  [place] 1:38.9 AP% 24.4 CDE 24.10 Fluid pack lot # [id number]  . HEMORRHOID BANDING  August 2015  . STAPLE HEMORRHOIDECTOMY  April 09, 2006  . THYROID SURGERY      No family history on file.  Social History:  reports that she has never smoked. She has never used smokeless tobacco. She reports that she drinks alcohol. She reports that she does not use drugs.  Allergies:  Allergies  Allergen Reactions  . Vicodin [Hydrocodone-Acetaminophen]     Medications: I have reviewed the patient's current  medications.  Results for orders placed or performed during the hospital encounter of 08/13/18 (from the past 48 hour(s))  CBC with Differential     Status: Abnormal   Collection Time: 08/13/18  3:28 PM  Result Value Ref Range   WBC 5.8 4.0 - 10.5 K/uL   RBC 4.73 3.87 - 5.11 MIL/uL   Hemoglobin 15.1 (H) 12.0 - 15.0 g/dL   HCT 47.1 (H) 36.0 - 46.0 %   MCV 99.6 80.0 - 100.0 fL   MCH 31.9 26.0 - 34.0 pg   MCHC 32.1 30.0 - 36.0 g/dL   RDW 13.3 11.5 - 15.5 %   Platelets 147 (L) 150 - 400 K/uL   nRBC 0.0 0.0 - 0.2 %   Neutrophils Relative % 64 %   Neutro Abs 3.7 1.7 - 7.7 K/uL   Lymphocytes Relative 25 %   Lymphs Abs 1.5 0.7 - 4.0 K/uL   Monocytes Relative 6 %   Monocytes Absolute 0.4 0.1 - 1.0 K/uL   Eosinophils Relative 4 %   Eosinophils Absolute 0.2 0.0 - 0.5 K/uL   Basophils Relative 1 %   Basophils Absolute 0.0 0.0 - 0.1 K/uL   Immature Granulocytes 0 %   Abs Immature Granulocytes 0.02 0.00 - 0.07 K/uL    Comment: Performed at [facility], [address] [place], [place] [address]  Troponin I - Once     Status: None   Collection Time: 08/13/18  3:28 PM  Result Value Ref Range   Troponin I <0.03 <0.03  ng/mL    Comment: Performed at [facility], [address] [place], [place] [address]  Comprehensive metabolic panel     Status: Abnormal   Collection Time: 08/13/18  3:28 PM  Result Value Ref Range   Sodium 140 135 - 145 mmol/L   Potassium 3.6 3.5 - 5.1 mmol/L   Chloride 102 98 - 111 mmol/L   CO2 27 22 - 32 mmol/L   Glucose, Bld 111 (H) 70 - 99 mg/dL   BUN 15 8 - 23 mg/dL   Creatinine, Ser 0.50 0.44 - 1.00 mg/dL   Calcium 8.7 (L) 8.9 - 10.3 mg/dL   Total Protein 6.1 (L) 6.5 - 8.1 g/dL   Albumin 3.7 3.5 - 5.0 g/dL   AST 20 15 - 41 U/L   ALT 10 0 - 44 U/L   Alkaline Phosphatase 68 38 - 126 U/L   Total Bilirubin 1.0 0.3 - 1.2 mg/dL   GFR calc non Af Amer >60 >60 mL/min   GFR calc Af Amer >60 >60 mL/min   Anion gap 11 5 - 15    Comment: Performed  at [facility], [address] [place], [place] [address]  Lipase, blood     Status: None   Collection Time: 08/13/18  3:28 PM  Result Value Ref Range   Lipase 51 11 - 51 U/L    Comment: Performed at [facility], [address] [place], [place] [address]  Urinalysis, Complete w Microscopic     Status: Abnormal   Collection Time: 08/13/18  3:28 PM  Result Value Ref Range   Color, Urine YELLOW (A) YELLOW   APPearance HAZY (A) CLEAR   Specific Gravity, Urine 1.012 1.005 - 1.030   pH 6.0 5.0 - 8.0   Glucose, UA NEGATIVE NEGATIVE mg/dL   Hgb urine dipstick NEGATIVE NEGATIVE   Bilirubin Urine NEGATIVE NEGATIVE   Ketones, ur NEGATIVE NEGATIVE mg/dL   Protein, ur NEGATIVE NEGATIVE mg/dL   Nitrite NEGATIVE NEGATIVE   Leukocytes, UA SMALL (A) NEGATIVE   RBC / HPF 11-20 0 - 5 RBC/hpf   WBC, UA 6-10 0 - 5 WBC/hpf   Bacteria, UA NONE SEEN NONE SEEN   Squamous Epithelial / LPF 0-5 0 - 5   Mucus PRESENT    Hyaline Casts, UA PRESENT     Comment: Performed at [facility], [address] [place], [place] [address]  Hemoglobin A1c     Status: None   Collection Time: 08/13/18  3:33 PM  Result Value Ref Range   Hgb A1c MFr Bld 5.2 4.8 - 5.6 %    Comment: (NOTE) Pre diabetes:          5.7%-6.4% Diabetes:              >6.4% Glycemic control for   <7.0% adults with diabetes    Mean Plasma Glucose 102.54 mg/dL    Comment: Performed at [facility], [address] [place], [place] [address]  TSH     Status: None   Collection Time: 08/13/18  3:33 PM  Result Value Ref Range   TSH 3.750 0.350 - 4.500 uIU/mL    Comment: Performed by a 3rd Generation assay with a functional sensitivity of <=0.01 uIU/mL. Performed at [facility], [address], [place], [place] [address]   Basic metabolic panel     Status: Abnormal   Collection Time: 08/14/18  3:26 AM  Result Value Ref Range   Sodium 143 135 -  145 mmol/L   Potassium 3.1 (L) 3.5 - 5.1 mmol/L    Chloride 104 98 - 111 mmol/L   CO2 30 22 - 32 mmol/L   Glucose, Bld 95 70 - 99 mg/dL   BUN 13 8 - 23 mg/dL   Creatinine, Ser 0.55 0.44 - 1.00 mg/dL   Calcium 8.7 (L) 8.9 - 10.3 mg/dL   GFR calc non Af Amer >60 >60 mL/min   GFR calc Af Amer >60 >60 mL/min   Anion gap 9 5 - 15    Comment: Performed at [facility], [address] [place], [place] [address]  CBC     Status: None   Collection Time: 08/14/18  3:26 AM  Result Value Ref Range   WBC 4.7 4.0 - 10.5 K/uL   RBC 4.41 3.87 - 5.11 MIL/uL   Hemoglobin 14.1 12.0 - 15.0 g/dL   HCT 42.7 36.0 - 46.0 %   MCV 96.8 80.0 - 100.0 fL   MCH 32.0 26.0 - 34.0 pg   MCHC 33.0 30.0 - 36.0 g/dL   RDW 13.5 11.5 - 15.5 %   Platelets 157 150 - 400 K/uL   nRBC 0.0 0.0 - 0.2 %    Comment: Performed at [facility], [address] [place], [place] [address]  Protime-INR     Status: None   Collection Time: 08/14/18  3:26 AM  Result Value Ref Range   Prothrombin Time 13.2 11.4 - 15.2 seconds   INR 1.01     Comment: Performed at [facility], [address] [place], [place] [address]  Glucose, capillary     Status: None   Collection Time: 08/14/18  7:59 AM  Result Value Ref Range   Glucose-Capillary 89 70 - 99 mg/dL  Glucose, capillary     Status: None   Collection Time: 08/14/18 11:55 AM  Result Value Ref Range   Glucose-Capillary 82 70 - 99 mg/dL    Dg Chest 2 View  Result Date: 08/13/2018 CLINICAL DATA:  Vomiting, breast cancer EXAM: CHEST - 2 VIEW COMPARISON:  07/20/2018 chest radiograph. FINDINGS: Stable cardiomediastinal silhouette with top-normal heart size. No pneumothorax. No pleural effusion. Stable tiny peripheral left upper lung granuloma. No pulmonary edema. No acute consolidative airspace disease. Surgical clips are noted in the right axilla. IMPRESSION: No active cardiopulmonary disease. Electronically Signed   By: Ilona Sorrel M.D.   On: 08/13/2018 15:57    Review of Systems  Constitutional:  Positive for malaise/fatigue.  HENT: Positive for congestion.   Eyes: Negative.   Respiratory: Negative.   Cardiovascular: Negative.   Gastrointestinal: Negative.   Genitourinary: Negative.   Musculoskeletal: Positive for myalgias.  Neurological: Positive for weakness.  Endo/Heme/Allergies: Negative.   Psychiatric/Behavioral: Positive for memory loss.   Blood pressure 139/78, pulse 66, temperature 97.8 F (36.6 C), temperature source Oral, resp. rate 18, height _0  (1.651 m), weight 59.6 kg, SpO2 97 %. Physical Exam  Nursing note and vitals reviewed. Constitutional: She appears well-developed and well-nourished.  HENT:  Head: Normocephalic and atraumatic.  Eyes: Pupils are equal, round, and reactive to light. Conjunctivae and EOM are normal.  Neck: Normal range of motion. Neck supple.  Cardiovascular: Normal rate and regular rhythm.  Respiratory: Effort normal and breath sounds normal.  GI: Soft. Bowel sounds are normal.  Skin: Skin is warm and dry.  Psychiatric: Her speech is normal. Judgment and thought content normal. Her mood appears anxious. Cognition and memory are impaired. She is inattentive.  Assessment/Plan: Bradycardia Preop for ankle surgery on Monday Syncope Dementia Diabetes Anxiety . Plan Agree with rule out myocardial infarction Continue to hold beta-blocker No indication for permanent pacemaker Patient should be an acceptable risk for ankle surgery on Monday Dementia continue current management Hypertension reasonably controlled continue Benzapril but hold metoprolol Continue thyroid medication Do not recommend any further testing to modify her risk for surgery just continue to hold metoprolol because of bradycardia  Malon Siddall D Onica Davidovich 08/14/2018, 2:16 PM

## 2018-08-14 NOTE — Discharge Instructions (Signed)
Resume diet and activity as before ° ° °

## 2018-08-15 LAB — URINE CULTURE

## 2018-08-18 ENCOUNTER — Ambulatory Visit: Payer: Medicare Other | Admitting: Anesthesiology

## 2018-08-18 ENCOUNTER — Inpatient Hospital Stay: Payer: Medicare Other

## 2018-08-18 ENCOUNTER — Encounter
Admission: RE | Disposition: A | Discharge status: Skilled Nursing Facility | Payer: Self-pay | Source: Ambulatory Visit | Attending: Specialist

## 2018-08-18 ENCOUNTER — Inpatient Hospital Stay
Admission: RE | Admit: 2018-08-18 | Discharge: 2018-08-20 | DRG: 494 | Disposition: A | Discharge status: Skilled Nursing Facility | Payer: Medicare Other | Source: Ambulatory Visit | Attending: Specialist | Admitting: Specialist

## 2018-08-18 ENCOUNTER — Other Ambulatory Visit: Payer: Self-pay

## 2018-08-18 DIAGNOSIS — E119 Type 2 diabetes mellitus without complications: Secondary | ICD-10-CM | POA: Diagnosis present

## 2018-08-18 DIAGNOSIS — S82851A Displaced trimalleolar fracture of right lower leg, initial encounter for closed fracture: Secondary | ICD-10-CM | POA: Diagnosis present

## 2018-08-18 DIAGNOSIS — I10 Essential (primary) hypertension: Secondary | ICD-10-CM | POA: Diagnosis present

## 2018-08-18 DIAGNOSIS — F0390 Unspecified dementia without behavioral disturbance: Secondary | ICD-10-CM | POA: Diagnosis present

## 2018-08-18 DIAGNOSIS — X58XXXA Exposure to other specified factors, initial encounter: Secondary | ICD-10-CM | POA: Diagnosis present

## 2018-08-18 DIAGNOSIS — M25571 Pain in right ankle and joints of right foot: Secondary | ICD-10-CM | POA: Diagnosis present

## 2018-08-18 DIAGNOSIS — Z7984 Long term (current) use of oral hypoglycemic drugs: Secondary | ICD-10-CM | POA: Diagnosis not present

## 2018-08-18 HISTORY — DX: Thrombocytopenia, unspecified: D69.6

## 2018-08-18 HISTORY — DX: Unspecified dementia, unspecified severity, without behavioral disturbance, psychotic disturbance, mood disturbance, and anxiety: F03.90

## 2018-08-18 HISTORY — DX: Type 2 diabetes mellitus without complications: E11.9

## 2018-08-18 HISTORY — PX: ORIF ANKLE FRACTURE: SHX5408

## 2018-08-18 LAB — TYPE AND SCREEN
ABO/RH(D): A POS
Antibody Screen: NEGATIVE

## 2018-08-18 LAB — COMPREHENSIVE METABOLIC PANEL
ALT: 15 U/L (ref 0–44)
AST: 19 U/L (ref 15–41)
Albumin: 3.9 g/dL (ref 3.5–5.0)
Alkaline Phosphatase: 62 U/L (ref 38–126)
Anion gap: 10 (ref 5–15)
BUN: 14 mg/dL (ref 8–23)
CO2: 28 mmol/L (ref 22–32)
CREATININE: 0.52 mg/dL (ref 0.44–1.00)
Calcium: 9.2 mg/dL (ref 8.9–10.3)
Chloride: 102 mmol/L (ref 98–111)
GFR calc Af Amer: >60 mL/min (ref >60)
GFR calc non Af Amer: >60 mL/min (ref >60)
Glucose, Bld: 107 mg/dL — ABNORMAL HIGH (ref 70–99)
Potassium: 3.6 mmol/L (ref 3.5–5.1)
Sodium: 140 mmol/L (ref 135–145)
Total Bilirubin: 1.5 mg/dL — ABNORMAL HIGH (ref 0.3–1.2)
Total Protein: 6.5 g/dL (ref 6.5–8.1)

## 2018-08-18 LAB — CBC WITH DIFFERENTIAL/PLATELET
Abs Immature Granulocytes: 0 10*3/uL (ref 0.00–0.07)
Basophils Absolute: 0 10*3/uL (ref 0.0–0.1)
Basophils Relative: 1 %
Eosinophils Absolute: 0.2 10*3/uL (ref 0.0–0.5)
Eosinophils Relative: 4 %
HCT: 47.6 % — ABNORMAL HIGH (ref 36.0–46.0)
HEMOGLOBIN: 15.5 g/dL — AB (ref 12.0–15.0)
Immature Granulocytes: 0 %
LYMPHS ABS: 1.8 10*3/uL (ref 0.7–4.0)
Lymphocytes Relative: 33 %
MCH: 31.8 pg (ref 26.0–34.0)
MCHC: 32.6 g/dL (ref 30.0–36.0)
MCV: 97.5 fL (ref 80.0–100.0)
MONOS PCT: 9 %
Monocytes Absolute: 0.5 10*3/uL (ref 0.1–1.0)
Neutro Abs: 2.9 10*3/uL (ref 1.7–7.7)
Neutrophils Relative %: 53 %
Platelets: 144 10*3/uL — ABNORMAL LOW (ref 150–400)
RBC: 4.88 MIL/uL (ref 3.87–5.11)
RDW: 13.3 % (ref 11.5–15.5)
WBC: 5.4 10*3/uL (ref 4.0–10.5)
nRBC: 0 % (ref 0.0–0.2)

## 2018-08-18 LAB — ABO/RH: ABO/RH(D): A POS

## 2018-08-18 LAB — PROTIME-INR
INR: 0.85
Prothrombin Time: 11.6 seconds (ref 11.4–15.2)

## 2018-08-18 SURGERY — OPEN REDUCTION INTERNAL FIXATION (ORIF) ANKLE FRACTURE
Anesthesia: Spinal | Site: Ankle | Laterality: Right

## 2018-08-18 MED ORDER — PHENYLEPHRINE HCL 10 MG/ML IJ SOLN: INTRAMUSCULAR | Status: DC | PRN

## 2018-08-18 MED ORDER — DOCUSATE SODIUM 100 MG PO CAPS
100.0000 mg | ORAL_CAPSULE | Freq: Two times a day (BID) | ORAL | Status: DC
  Administered 2018-08-18 – 2018-08-20 (×4): 100 mg via ORAL
  Filled 2018-08-18 (×4): qty 1

## 2018-08-18 MED ORDER — CELECOXIB 200 MG PO CAPS
200.0000 mg | ORAL_CAPSULE | ORAL | Status: AC
  Administered 2018-08-18: 200 mg via ORAL

## 2018-08-18 MED ORDER — BISACODYL 10 MG RE SUPP: 10.0000 mg | Freq: Every day | RECTAL | Status: DC | PRN

## 2018-08-18 MED ORDER — MORPHINE SULFATE (PF) 2 MG/ML IV SOLN
0.5000 mg | INTRAVENOUS | Status: DC | PRN
  Administered 2018-08-18: 0.5 mg via INTRAVENOUS
  Filled 2018-08-18: qty 1

## 2018-08-18 MED ORDER — GABAPENTIN 300 MG PO CAPS
300.0000 mg | ORAL_CAPSULE | Freq: Three times a day (TID) | ORAL | Status: DC
  Administered 2018-08-18 – 2018-08-20 (×6): 300 mg via ORAL
  Filled 2018-08-18 (×6): qty 1

## 2018-08-18 MED ORDER — VITAMIN C 500 MG PO TABS
1000.0000 mg | ORAL_TABLET | Freq: Every day | ORAL | Status: DC
  Administered 2018-08-19 – 2018-08-20 (×2): 1000 mg via ORAL
  Filled 2018-08-18 (×2): qty 2

## 2018-08-18 MED ORDER — VITAMIN D 25 MCG (1000 UNIT) PO TABS
1000.0000 [IU] | ORAL_TABLET | Freq: Every day | ORAL | Status: DC
  Administered 2018-08-19 – 2018-08-20 (×2): 1000 [IU] via ORAL
  Filled 2018-08-18 (×2): qty 1

## 2018-08-18 MED ORDER — DONEPEZIL HCL 5 MG PO TABS
10.0000 mg | ORAL_TABLET | Freq: Every day | ORAL | Status: DC
  Administered 2018-08-18 – 2018-08-19 (×2): 10 mg via ORAL
  Filled 2018-08-18 (×3): qty 2

## 2018-08-18 MED ORDER — MIDAZOLAM HCL 2 MG/2ML IJ SOLN
INTRAMUSCULAR | Status: DC | PRN
  Administered 2018-08-18 (×2): 1 mg via INTRAVENOUS

## 2018-08-18 MED ORDER — CEFAZOLIN SODIUM-DEXTROSE 2-4 GM/100ML-% IV SOLN
2.0000 g | Freq: Three times a day (TID) | INTRAVENOUS | Status: AC
  Administered 2018-08-18 – 2018-08-19 (×3): 2 g via INTRAVENOUS
  Filled 2018-08-18 (×3): qty 100

## 2018-08-18 MED ORDER — CLINDAMYCIN PHOSPHATE 600 MG/50ML IV SOLN
600.0000 mg | Freq: Three times a day (TID) | INTRAVENOUS | Status: AC
  Administered 2018-08-18 – 2018-08-19 (×3): 600 mg via INTRAVENOUS
  Filled 2018-08-18 (×3): qty 50

## 2018-08-18 MED ORDER — DEXMEDETOMIDINE HCL 200 MCG/2ML IV SOLN
INTRAVENOUS | Status: DC | PRN
  Administered 2018-08-18 (×2): 8 ug via INTRAVENOUS

## 2018-08-18 MED ORDER — CHLORHEXIDINE GLUCONATE CLOTH 2 % EX PADS: 6.0000 | MEDICATED_PAD | Freq: Once | CUTANEOUS | Status: DC

## 2018-08-18 MED ORDER — METHOCARBAMOL 1000 MG/10ML IJ SOLN
500.0000 mg | Freq: Four times a day (QID) | INTRAVENOUS | Status: DC | PRN
  Filled 2018-08-18: qty 5

## 2018-08-18 MED ORDER — SODIUM CHLORIDE 0.9 % IV SOLN
Freq: Once | INTRAVENOUS | Status: AC
  Administered 2018-08-18: 11:00:00 via INTRAVENOUS

## 2018-08-18 MED ORDER — FENTANYL CITRATE (PF) 250 MCG/5ML IJ SOLN
INTRAMUSCULAR | Status: AC
  Filled 2018-08-18: qty 5

## 2018-08-18 MED ORDER — CLINDAMYCIN PHOSPHATE 600 MG/50ML IV SOLN: 600.0000 mg | Freq: Three times a day (TID) | INTRAVENOUS | Status: DC

## 2018-08-18 MED ORDER — LIDOCAINE HCL (PF) 2 % IJ SOLN
INTRAMUSCULAR | Status: AC
  Filled 2018-08-18: qty 10

## 2018-08-18 MED ORDER — SODIUM CHLORIDE FLUSH 0.9 % IV SOLN
INTRAVENOUS | Status: AC
  Filled 2018-08-18: qty 10

## 2018-08-18 MED ORDER — PHENYLEPHRINE HCL 10 MG/ML IJ SOLN
INTRAMUSCULAR | Status: AC
  Filled 2018-08-18: qty 1

## 2018-08-18 MED ORDER — PROPOFOL 10 MG/ML IV BOLUS
INTRAVENOUS | Status: AC
  Filled 2018-08-18: qty 20

## 2018-08-18 MED ORDER — FAMOTIDINE 20 MG PO TABS
20.0000 mg | ORAL_TABLET | Freq: Once | ORAL | Status: AC
  Administered 2018-08-18: 20 mg via ORAL

## 2018-08-18 MED ORDER — HYDROMORPHONE HCL 1 MG/ML IJ SOLN
INTRAMUSCULAR | Status: AC
  Filled 2018-08-18: qty 1

## 2018-08-18 MED ORDER — EPHEDRINE SULFATE 50 MG/ML IJ SOLN
5.0000 mg | Freq: Once | INTRAMUSCULAR | Status: AC
  Administered 2018-08-18: 5 mg via INTRAVENOUS

## 2018-08-18 MED ORDER — ONDANSETRON HCL 4 MG/2ML IJ SOLN
INTRAMUSCULAR | Status: DC | PRN
  Administered 2018-08-18: 4 mg via INTRAVENOUS

## 2018-08-18 MED ORDER — FLEET ENEMA 7-19 GM/118ML RE ENEM: 1.0000 | ENEMA | Freq: Once | RECTAL | Status: DC | PRN

## 2018-08-18 MED ORDER — FUROSEMIDE 20 MG PO TABS
20.0000 mg | ORAL_TABLET | Freq: Every day | ORAL | Status: DC
  Administered 2018-08-20: 20 mg via ORAL
  Filled 2018-08-18 (×2): qty 1

## 2018-08-18 MED ORDER — EPHEDRINE SULFATE 50 MG/ML IJ SOLN
INTRAMUSCULAR | Status: AC
  Administered 2018-08-18: 5 mg via INTRAVENOUS
  Filled 2018-08-18: qty 1

## 2018-08-18 MED ORDER — LACTATED RINGERS IV SOLN
INTRAVENOUS | Status: DC | PRN
  Administered 2018-08-18: 08:00:00 via INTRAVENOUS

## 2018-08-18 MED ORDER — NEOMYCIN-POLYMYXIN B GU 40-200000 IR SOLN
Status: AC
  Filled 2018-08-18: qty 2

## 2018-08-18 MED ORDER — MIDAZOLAM HCL 2 MG/2ML IJ SOLN
INTRAMUSCULAR | Status: AC
  Filled 2018-08-18: qty 2

## 2018-08-18 MED ORDER — PHENYLEPHRINE HCL 10 MG/ML IJ SOLN: 0.1000 mg | Freq: Once | INTRAMUSCULAR | Status: DC

## 2018-08-18 MED ORDER — LACTATED RINGERS IV SOLN
INTRAVENOUS | Status: DC
  Administered 2018-08-18: 1000 mL via INTRAVENOUS
  Administered 2018-08-18: 11:00:00 via INTRAVENOUS

## 2018-08-18 MED ORDER — NEOMYCIN-POLYMYXIN B GU 40-200000 IR SOLN
Status: DC | PRN
  Administered 2018-08-18: 4 mL

## 2018-08-18 MED ORDER — METHOCARBAMOL 500 MG PO TABS
500.0000 mg | ORAL_TABLET | Freq: Four times a day (QID) | ORAL | Status: DC | PRN
  Administered 2018-08-18: 500 mg via ORAL
  Filled 2018-08-18: qty 1

## 2018-08-18 MED ORDER — METOCLOPRAMIDE HCL 5 MG/ML IJ SOLN: 5.0000 mg | Freq: Three times a day (TID) | INTRAMUSCULAR | Status: DC | PRN

## 2018-08-18 MED ORDER — SODIUM CHLORIDE 0.45 % IV SOLN
INTRAVENOUS | Status: DC
  Administered 2018-08-18 – 2018-08-19 (×2): via INTRAVENOUS

## 2018-08-18 MED ORDER — BENAZEPRIL HCL 20 MG PO TABS
20.0000 mg | ORAL_TABLET | Freq: Every day | ORAL | Status: DC
  Filled 2018-08-18 (×2): qty 1

## 2018-08-18 MED ORDER — PHENYLEPHRINE HCL 10 MG/ML IJ SOLN
INTRAMUSCULAR | Status: AC
  Administered 2018-08-18: 0.1 mg via INTRAVENOUS
  Filled 2018-08-18: qty 1

## 2018-08-18 MED ORDER — ONDANSETRON HCL 4 MG PO TABS: 4.0000 mg | ORAL_TABLET | Freq: Four times a day (QID) | ORAL | Status: DC | PRN

## 2018-08-18 MED ORDER — CLINDAMYCIN PHOSPHATE 600 MG/50ML IV SOLN
600.0000 mg | INTRAVENOUS | Status: AC
  Administered 2018-08-18: 600 mg via INTRAVENOUS

## 2018-08-18 MED ORDER — TRAMADOL HCL 50 MG PO TABS
50.0000 mg | ORAL_TABLET | Freq: Four times a day (QID) | ORAL | Status: DC
  Administered 2018-08-18 – 2018-08-20 (×8): 50 mg via ORAL
  Filled 2018-08-18 (×8): qty 1

## 2018-08-18 MED ORDER — ACETAMINOPHEN 325 MG PO TABS
325.0000 mg | ORAL_TABLET | Freq: Four times a day (QID) | ORAL | Status: DC | PRN
  Administered 2018-08-19: 650 mg via ORAL
  Filled 2018-08-18: qty 2

## 2018-08-18 MED ORDER — CEFAZOLIN SODIUM-DEXTROSE 2-4 GM/100ML-% IV SOLN
2.0000 g | INTRAVENOUS | Status: AC
  Administered 2018-08-18: 2 g via INTRAVENOUS

## 2018-08-18 MED ORDER — BUPIVACAINE HCL 0.5 % IJ SOLN
INTRAMUSCULAR | Status: DC | PRN
  Administered 2018-08-18: 20 mL

## 2018-08-18 MED ORDER — ENOXAPARIN SODIUM 40 MG/0.4ML ~~LOC~~ SOLN
40.0000 mg | SUBCUTANEOUS | Status: DC
  Administered 2018-08-19 – 2018-08-20 (×2): 40 mg via SUBCUTANEOUS
  Filled 2018-08-18 (×2): qty 0.4

## 2018-08-18 MED ORDER — GABAPENTIN 300 MG PO CAPS
300.0000 mg | ORAL_CAPSULE | ORAL | Status: AC
  Administered 2018-08-18: 300 mg via ORAL

## 2018-08-18 MED ORDER — PHENYLEPHRINE HCL 10 MG/ML IJ SOLN
0.1000 mg | Freq: Once | INTRAMUSCULAR | Status: AC
  Administered 2018-08-18: 0.1 mg via INTRAVENOUS

## 2018-08-18 MED ORDER — AMLODIPINE BESYLATE 10 MG PO TABS
10.0000 mg | ORAL_TABLET | Freq: Every day | ORAL | Status: DC
  Administered 2018-08-20: 10 mg via ORAL
  Filled 2018-08-18 (×2): qty 1

## 2018-08-18 MED ORDER — ADULT MULTIVITAMIN W/MINERALS CH
1.0000 | ORAL_TABLET | Freq: Every day | ORAL | Status: DC
  Administered 2018-08-19 – 2018-08-20 (×2): 1 via ORAL
  Filled 2018-08-18 (×2): qty 1

## 2018-08-18 MED ORDER — ONDANSETRON HCL 4 MG/2ML IJ SOLN: 4.0000 mg | Freq: Four times a day (QID) | INTRAMUSCULAR | Status: DC | PRN

## 2018-08-18 MED ORDER — LIDOCAINE HCL (PF) 0.5 % IJ SOLN
INTRAMUSCULAR | Status: DC | PRN
  Administered 2018-08-18: 15 mg via INTRAVENOUS

## 2018-08-18 MED ORDER — BUPIVACAINE HCL (PF) 0.5 % IJ SOLN
INTRAMUSCULAR | Status: AC
  Filled 2018-08-18: qty 30

## 2018-08-18 MED ORDER — LEVOTHYROXINE SODIUM 112 MCG PO TABS
112.0000 ug | ORAL_TABLET | Freq: Every day | ORAL | Status: DC
  Administered 2018-08-19 – 2018-08-20 (×2): 112 ug via ORAL
  Filled 2018-08-18 (×2): qty 1

## 2018-08-18 MED ORDER — PHENYLEPHRINE HCL 10 MG/ML IJ SOLN
INTRAMUSCULAR | Status: DC | PRN
  Administered 2018-08-18 (×2): 200 ug via INTRAVENOUS
  Administered 2018-08-18 (×9): 100 ug via INTRAVENOUS

## 2018-08-18 MED ORDER — METOCLOPRAMIDE HCL 10 MG PO TABS: 5.0000 mg | ORAL_TABLET | Freq: Three times a day (TID) | ORAL | Status: DC | PRN

## 2018-08-18 MED ORDER — MAGNESIUM HYDROXIDE 400 MG/5ML PO SUSP
30.0000 mL | Freq: Every day | ORAL | Status: DC | PRN
  Administered 2018-08-19: 30 mL via ORAL
  Filled 2018-08-18: qty 30

## 2018-08-18 MED ORDER — ROCURONIUM BROMIDE 50 MG/5ML IV SOLN
INTRAVENOUS | Status: AC
  Filled 2018-08-18: qty 1

## 2018-08-18 SURGICAL SUPPLY — 69 items
1.25 mm Non-Threaded Guide Wire ×4 IMPLANT
BLADE SURG SZ10 CARB STEEL (BLADE) ×3 IMPLANT
BNDG COHESIVE 4X5 TAN STRL (GAUZE/BANDAGES/DRESSINGS) ×3 IMPLANT
BNDG ESMARK 6X12 TAN STRL LF (GAUZE/BANDAGES/DRESSINGS) ×5 IMPLANT
CANISTER SUCT 1200ML W/VALVE (MISCELLANEOUS) ×3 IMPLANT
CHLORAPREP W/TINT 26ML (MISCELLANEOUS) ×3 IMPLANT
COVER WAND RF STERILE (DRAPES) ×3 IMPLANT
CUFF TOURN 24 STER (MISCELLANEOUS) ×2 IMPLANT
CUFF TOURN 30 STER DUAL PORT (MISCELLANEOUS) IMPLANT
DRAPE FLUOR MINI C-ARM 54X84 (DRAPES) ×3 IMPLANT
DRSG TEGADERM 2-3/8X2-3/4 SM (GAUZE/BANDAGES/DRESSINGS) ×4 IMPLANT
ELECT REM PT RETURN 9FT ADLT (ELECTROSURGICAL) ×3
ELECTRODE REM PT RTRN 9FT ADLT (ELECTROSURGICAL) ×1 IMPLANT
GAUZE PETRO XEROFOAM 1X8 (MISCELLANEOUS) ×3 IMPLANT
GAUZE SPONGE 4X4 12PLY STRL (GAUZE/BANDAGES/DRESSINGS) ×3 IMPLANT
GLOVE BIO SURGEON STRL SZ 6.5 (GLOVE) ×3 IMPLANT
GLOVE BIO SURGEON STRL SZ8 (GLOVE) ×2 IMPLANT
GLOVE BIO SURGEONS STRL SZ 6.5 (GLOVE) ×3
GLOVE BIOGEL PI IND STRL 7.0 (GLOVE) IMPLANT
GLOVE BIOGEL PI INDICATOR 7.0 (GLOVE) ×2
GLOVE SURG ORTHO 8.0 STRL STRW (GLOVE) ×3 IMPLANT
GOWN STRL REUS W/ TWL LRG LVL3 (GOWN DISPOSABLE) ×1 IMPLANT
GOWN STRL REUS W/TWL LRG LVL3 (GOWN DISPOSABLE) ×2
GOWN STRL REUS W/TWL LRG LVL4 (GOWN DISPOSABLE) ×3 IMPLANT
GUIDEWARE NON THREAD 1.25X150 (WIRE) ×6
GUIDEWIRE NON THREAD 1.25X150 (WIRE) IMPLANT
HANDLE YANKAUER SUCT BULB TIP (MISCELLANEOUS) ×3 IMPLANT
K-WIRE .062 (WIRE) ×2
K-WIRE FX6X.062X2 END TROC (WIRE) ×1
KIT TURNOVER KIT A (KITS) ×3 IMPLANT
KWIRE FX6X.062X2 END TROC (WIRE) IMPLANT
LABEL OR SOLS (LABEL) ×3 IMPLANT
NDL SPNL 18GX3.5 QUINCKE PK (NEEDLE) IMPLANT
NEEDLE SPNL 18GX3.5 QUINCKE PK (NEEDLE) ×3 IMPLANT
NS IRRIG 1000ML POUR BTL (IV SOLUTION) ×3 IMPLANT
PACK EXTREMITY ARMC (MISCELLANEOUS) ×3 IMPLANT
PAD PREP 24X41 OB/GYN DISP (PERSONAL CARE ITEMS) ×3 IMPLANT
PADDING CAST BLEND 6X4 STRL (MISCELLANEOUS) IMPLANT
PADDING STRL CAST 6IN (MISCELLANEOUS) ×2
PROS LCP PLATE 6H 85MM (Plate) ×3 IMPLANT
PROSTHESIS LCP PLATE 6H 85MM (Plate) IMPLANT
SCREW CORTEX 3.5 12MM (Screw) ×2 IMPLANT
SCREW CORTEX 3.5 14MM (Screw) ×2 IMPLANT
SCREW CORTEX 3.5 16MM (Screw) ×2 IMPLANT
SCREW CORTEX 3.5 18MM (Screw) ×2 IMPLANT
SCREW CORTEX 3.5 20MM (Screw) ×2 IMPLANT
SCREW CORTEX 3.5 22MM (Screw) ×2 IMPLANT
SCREW LOCK CORT ST 3.5X12 (Screw) IMPLANT
SCREW LOCK CORT ST 3.5X14 (Screw) IMPLANT
SCREW LOCK CORT ST 3.5X16 (Screw) IMPLANT
SCREW LOCK CORT ST 3.5X18 (Screw) IMPLANT
SCREW LOCK CORT ST 3.5X20 (Screw) IMPLANT
SCREW LOCK CORT ST 3.5X22 (Screw) IMPLANT
SCREW LOCK T15 FT 20X3.5XST (Screw) IMPLANT
SCREW LOCKING 3.5X20 (Screw) ×2 IMPLANT
SPLINT CAST 1 STEP 4X30 (MISCELLANEOUS) ×2 IMPLANT
SPLINT CAST 1 STEP 5X30 WHT (MISCELLANEOUS) ×1 IMPLANT
SPONGE LAP 18X18 RF (DISPOSABLE) ×3 IMPLANT
STAPLER SKIN PROX 35W (STAPLE) ×3 IMPLANT
STOCKINETTE BIAS CUT 6 980064 (GAUZE/BANDAGES/DRESSINGS) ×3 IMPLANT
STOCKINETTE STRL 6IN 960660 (GAUZE/BANDAGES/DRESSINGS) ×3 IMPLANT
SUT QUILL PDO 0 36 36 VIOLET (SUTURE) ×4 IMPLANT
SUT QUILL PDO 2-0 40CM 26MM (SUTURE) ×2 IMPLANT
SUT VIC AB 2-0 CT1 27 (SUTURE) ×2
SUT VIC AB 2-0 CT1 TAPERPNT 27 (SUTURE) ×1 IMPLANT
SUT VIC AB 3-0 SH 27 (SUTURE) ×2
SUT VIC AB 3-0 SH 27X BRD (SUTURE) ×1 IMPLANT
TOWEL OR 17X26 4PK STRL BLUE (TOWEL DISPOSABLE) ×4 IMPLANT
WASHER 7MM DIA (Washer) ×4 IMPLANT

## 2018-08-18 NOTE — Anesthesia Post-op Follow-up Note (Signed)
Anesthesia QCDR form completed.        

## 2018-08-18 NOTE — NC FL2 (Signed)
[place] LEVEL OF CARE SCREENING TOOL     IDENTIFICATION  Patient Name: Alyssa Holloway Birthdate: June 22, 1921 Sex: female Admission Date (Current Location): 08/18/2018  [place] and [place] Number:  Engineering geologist and Address:  [facility], [address] [place], [place] [address]      Provider Number: [id number]  Attending Physician Name and Address:  Earnestine Leys, MD  Relative Name and Phone Number:       Current Level of Care: Hospital Recommended Level of Care: [place] Prior Approval Number:    Date Approved/Denied:   PASRR Number: [id number] )  Discharge Plan: SNF    Current Diagnoses: Patient Active Problem List   Diagnosis Date Noted  . Trimalleolar fracture of ankle, closed, right, initial encounter 08/18/2018  . Bradycardia 08/13/2018  . Internal hemorrhoid 05/11/2014    Orientation RESPIRATION BLADDER Height & Weight     Self, Time, Situation, Place  Normal Continent Weight: 131 lb 6.3 oz (59.6 kg) Height:  5\' 5"  (165.1 cm)  BEHAVIORAL SYMPTOMS/MOOD NEUROLOGICAL BOWEL NUTRITION STATUS      Continent Diet(Diet: Full Liquid to be Advanced. )  AMBULATORY STATUS COMMUNICATION OF NEEDS Skin   Extensive Assist Verbally Surgical wounds(Incision: Right Ankle)                       Personal Care Assistance Level of Assistance  Bathing, Feeding, Dressing Bathing Assistance: Limited assistance Feeding assistance: Independent Dressing Assistance: Limited assistance     Functional Limitations Info  Sight, Hearing, Speech Sight Info: Adequate Hearing Info: Adequate Speech Info: Adequate    SPECIAL CARE FACTORS FREQUENCY  PT (By licensed PT), OT (By licensed OT)     PT Frequency: (5) OT Frequency: (5)            Contractures      Additional Factors Info  Code Status, Allergies Code Status Info: (Full Code. ) Allergies Info: (Vicodin Hydrocodone-acetaminophen)            Current Medications (08/18/2018):  This is the current hospital active medication list Current Facility-Administered Medications  Medication Dose Route Frequency Provider Last Rate Last Dose  . 0.45 % sodium chloride infusion   Intravenous Continuous Earnestine Leys, MD 75 mL/hr at 08/18/18 1504    . [START ON 08/19/2018] acetaminophen (TYLENOL) tablet 325-650 mg  325-650 mg Oral Q6H PRN Earnestine Leys, MD      . Derrill Memo ON 08/19/2018] amLODipine (NORVASC) tablet 10 mg  10 mg Oral Daily Earnestine Leys, MD      . Derrill Memo ON 08/19/2018] benazepril (LOTENSIN) tablet 20 mg  20 mg Oral Daily Earnestine Leys, MD      . bisacodyl (DULCOLAX) suppository 10 mg  10 mg Rectal Daily PRN Earnestine Leys, MD      . ceFAZolin (ANCEF) IVPB 2g/100 mL premix  2 g Intravenous Lajuan Lines, MD      . Derrill Memo ON 08/19/2018] cholecalciferol (VITAMIN D3) tablet 1,000 Units  1,000 Units Oral Daily Earnestine Leys, MD      . clindamycin (CLEOCIN) IVPB 600 mg  600 mg Intravenous Lajuan Lines, MD      . docusate sodium (COLACE) capsule 100 mg  100 mg Oral BID Earnestine Leys, MD      . donepezil (ARICEPT) tablet 10 mg  10 mg Oral Tora Duck, MD      . Derrill Memo ON 08/19/2018] enoxaparin (LOVENOX) injection 40 mg  40 mg Subcutaneous Q24H Sabra Heck,  Nadara Mustard, MD      . Derrill Memo ON 08/19/2018] furosemide (LASIX) tablet 20 mg  20 mg Oral Daily Earnestine Leys, MD      . gabapentin (NEURONTIN) capsule 300 mg  300 mg Oral TID Earnestine Leys, MD      . Derrill Memo ON 08/19/2018] levothyroxine (SYNTHROID, LEVOTHROID) tablet 112 mcg  112 mcg Oral Daily Earnestine Leys, MD      . magnesium hydroxide (MILK OF MAGNESIA) suspension 30 mL  30 mL Oral Daily PRN Earnestine Leys, MD      . methocarbamol (ROBAXIN) tablet 500 mg  500 mg Oral Q6H PRN Earnestine Leys, MD       Or  . methocarbamol (ROBAXIN) 500 mg in dextrose 5 % 50 mL IVPB  500 mg Intravenous Q6H PRN Earnestine Leys, MD      . metoCLOPramide (REGLAN) tablet 5-10 mg  5-10 mg Oral Q8H PRN  Earnestine Leys, MD       Or  . metoCLOPramide (REGLAN) injection 5-10 mg  5-10 mg Intravenous Q8H PRN Earnestine Leys, MD      . morphine 2 MG/ML injection 0.5-1 mg  0.5-1 mg Intravenous Q2H PRN Earnestine Leys, MD      . Derrill Memo ON 08/19/2018] multivitamin with minerals tablet 1 tablet  1 tablet Oral Daily Earnestine Leys, MD      . ondansetron [facility]) tablet 4 mg  4 mg Oral Q6H PRN Earnestine Leys, MD       Or  . ondansetron [facility]) injection 4 mg  4 mg Intravenous Q6H PRN Earnestine Leys, MD      . sodium chloride flush 0.9 % injection           . sodium phosphate (FLEET) 7-19 GM/118ML enema 1 enema  1 enema Rectal Once PRN Earnestine Leys, MD      . traMADol Veatrice Bourbon) tablet 50 mg  50 mg Oral Q6H Earnestine Leys, MD      . Derrill Memo ON 08/19/2018] vitamin C (ASCORBIC ACID) tablet 1,000 mg  1,000 mg Oral Daily Earnestine Leys, MD         Discharge Medications: Please see discharge summary for a list of discharge medications.  Relevant Imaging Results:  Relevant Lab Results:   Additional Information (SSN: [id number])  Abdulkarim Eberlin, Veronia Beets, LCSW

## 2018-08-18 NOTE — Anesthesia Preprocedure Evaluation (Signed)
Anesthesia Evaluation  Patient identified by MRN, date of birth, ID band Patient awake    Reviewed: Allergy & Precautions, NPO status , Patient's Chart, lab work & pertinent test results  History of Anesthesia Complications Negative for: history of anesthetic complications  Airway Mallampati: II       Dental   Pulmonary neg sleep apnea, neg COPD,           Cardiovascular hypertension, Pt. on medications (-) Past MI and (-) CHF (-) dysrhythmias (-) Valvular Problems/Murmurs     Neuro/Psych neg Seizures Anxiety Dementia    GI/Hepatic Neg liver ROS, neg GERD  ,  Endo/Other  diabetes, Type 2, Oral Hypoglycemic AgentsHypothyroidism   Renal/GU negative Renal ROS     Musculoskeletal   Abdominal   Peds  Hematology   Anesthesia Other Findings   Reproductive/Obstetrics                            Anesthesia Physical Anesthesia Plan  ASA: III  Anesthesia Plan: Spinal   Post-op Pain Management:    Induction:   PONV Risk Score and Plan: 2  Airway Management Planned: Nasal Cannula  Additional Equipment:   Intra-op Plan:   Post-operative Plan:   Informed Consent: I have reviewed the patients History and Physical, chart, labs and discussed the procedure including the risks, benefits and alternatives for the proposed anesthesia with the patient or authorized representative who has indicated his/her understanding and acceptance.     Plan Discussed with:   Anesthesia Plan Comments:         Anesthesia Quick Evaluation

## 2018-08-18 NOTE — H&P (Signed)
THE PATIENT WAS SEEN PRIOR TO SURGERY TODAY.  HISTORY, ALLERGIES, HOME MEDICATIONS AND OPERATIVE PROCEDURE WERE REVIEWED. RISKS AND BENEFITS OF SURGERY DISCUSSED WITH PATIENT AGAIN.  NO CHANGES FROM INITIAL HISTORY AND PHYSICAL NOTED.    

## 2018-08-18 NOTE — Transfer of Care (Signed)
Immediate Anesthesia Transfer of Care Note  Patient: Alyssa Holloway  Procedure(s) Performed: OPEN REDUCTION INTERNAL FIXATION (ORIF) ANKLE FRACTURE (Right Ankle)  Patient Location: PACU  Anesthesia Type:General  Level of Consciousness: sedated  Airway & Oxygen Therapy: Patient Spontanous Breathing and Patient connected to face mask oxygen  Post-op Assessment: Report given to RN and Post -op Vital signs reviewed and stable  Post vital signs: Reviewed and stable  Last Vitals:  Vitals Value Taken Time  BP 81/57 08/18/2018 10:59 AM  Temp 36.6 C 08/18/2018 10:59 AM  Pulse 84 08/18/2018 11:04 AM  Resp 19 08/18/2018 11:04 AM  SpO2 97 % 08/18/2018 11:04 AM  Vitals shown include unvalidated device data.  Last Pain:  Vitals:   08/18/18 0635  TempSrc: Oral  PainSc: 0-No pain         Complications: No apparent anesthesia complications

## 2018-08-18 NOTE — Progress Notes (Signed)
Chaplain was rounding and was referred by nurse to answer question regarding HCPOA. Chaplain explain state law regarding decision. Daughter said Pt has a LW and chaplain explained what the process is in the hierarchy of decision making.    08/18/18 1500  Clinical Encounter Type  Visited With Family  Visit Type Initial  Referral From Nurse  Spiritual Encounters  Spiritual Needs Other (Comment) (Answer questions regarding HCPOA)

## 2018-08-18 NOTE — Op Note (Signed)
08/18/2018  PATIENT:  Alyssa Holloway    PRE-OPERATIVE DIAGNOSIS:  closed trimalleolar fracture of right ankle displaced with early healing present  POST-OPERATIVE DIAGNOSIS:  Same  PROCEDURE:  OPEN REDUCTION INTERNAL FIXATION (ORIF) ANKLE FRACTURE  SURGEON:  Park Breed, MD   ASST: Carlynn Spry, PA-C  ANESTHESIA: Spinal  TOURNIQUET TIME: 105 MIN  PREOPERATIVE INDICATIONS:  Alyssa Holloway is a  84 y.o. female with a diagnosis of closed trimalleolar fracture of right ankle who elected for surgical management to minimize the risk for malunion and nonunion and post-traumatic arthritis.  The fracture was displaced but she had severe skin necrosis medially which prevented early surgery.  Surgery has been delayed to allow for healing of the medial wound.  Swelling has decreased significantly and there is a small area of eschar medially with no redness or sign of infection.  I felt that it was safe to proceed with surgery avoiding this area of bad skin.  The risks benefits and alternatives were discussed with the patient preoperatively including but not limited to the risks of infection, bleeding, nerve injury, cardiopulmonary complications, the need for revision surgery, the need for hardware removal, among others, and the patient was willing to proceed.  OPERATIVE IMPLANTS: Synthes locking plate 6 hole laterally.  Two4.0 cannulated screws medially.  0.62 K wire laterally  OPERATIVE PROCEDURE: The patient was brought to the operating room and placed in the supine position. All bony prominences were padded.  Spinal anesthesia was administered. The lower extremity was prepped and draped in the usual sterile fashion. The leg was elevated and exsanguinated and the tourniquet was inflated. Time out was performed.   Incision was made over the distal fibula and the fracture was exposed.  Thick periosteal layer of early healing was peeled off the bone.  The distal portion of the lateral malleolus  was displaced laterally.  Early healing was taken down with a rondure and periosteal elevator.  Distal fragment was completely freed up.  It was then reduced and pinned with a 0.62 K wire from dorsal to volar.  The medial side was then opened with a curved incision avoiding the area of eschar.  Dissection was carried out carefully down to the periosteum.  The medial malleolus was displaced laterally and was freed up from adhesions in her early healing.  Raw bone edges were curetted free.  Medial malleolus was held in place and pinned with 2 K wires.  Fluoroscopy showed that this enabled [place] to bring that talus all the way over medially where it belonged.  . I then placed 2  36mm screws which had satisfactory fixation. Fluoroscopy showed good reduction and hardware placement.  I then applied a 6-hole small fragment locking plate and secured it proximally with cortical screws and distally with cortical and locking screws.  I used the Fluoroscan to confirm satisfactory reduction and fixation.  The talus did appear to be well reduced below the tibia and the hardware was in satisfactory position.   After irrigation the ostial layer and subcuticular layer laterally were closed with 0 Quill suture.  Medially the subcutaneous layer was after irrigation was closed with 2 oh Quill.  Both wounds were then closed with staples. The syndesmosis was stressed using live fluoroscopy and found to be stable.   Sponge and needle counts were correct. The wounds were injected with local anesthetic. Sterile gauze was applied followed by a posterior splint. She was returned to the PACU in stable and satisfactory condition. There were  no complications.  Park Breed, MD

## 2018-08-18 NOTE — Anesthesia Postprocedure Evaluation (Signed)
Anesthesia Post Note  Patient: Alyssa Holloway  Procedure(s) Performed: OPEN REDUCTION INTERNAL FIXATION (ORIF) ANKLE FRACTURE (Right Ankle)  Patient location during evaluation: PACU Anesthesia Type: Spinal Level of consciousness: awake and alert Pain management: pain level controlled Vital Signs Assessment: post-procedure vital signs reviewed and stable Respiratory status: spontaneous breathing and respiratory function stable Cardiovascular status: stable Anesthetic complications: no     Last Vitals:  Vitals:   08/18/18 1123 08/18/18 1127  BP: (!) 84/62 (!) 82/59  Pulse: 86 87  Resp: 14 13  Temp:    SpO2: 100% 100%    Last Pain:  Vitals:   08/18/18 1114  TempSrc:   PainSc: Asleep                 Pernell Lenoir K

## 2018-08-19 ENCOUNTER — Encounter: Payer: Self-pay | Admitting: Specialist

## 2018-08-19 LAB — COMPREHENSIVE METABOLIC PANEL
ALBUMIN: 2.7 g/dL — AB (ref 3.5–5.0)
ALT: 8 U/L (ref 0–44)
AST: 18 U/L (ref 15–41)
Alkaline Phosphatase: 52 U/L (ref 38–126)
Anion gap: 6 (ref 5–15)
BUN: 11 mg/dL (ref 8–23)
CO2: 28 mmol/L (ref 22–32)
Calcium: 8.1 mg/dL — ABNORMAL LOW (ref 8.9–10.3)
Chloride: 103 mmol/L (ref 98–111)
Creatinine, Ser: 0.57 mg/dL (ref 0.44–1.00)
GFR calc Af Amer: >60 mL/min (ref >60)
Glucose, Bld: 129 mg/dL — ABNORMAL HIGH (ref 70–99)
Potassium: 4 mmol/L (ref 3.5–5.1)
Sodium: 137 mmol/L (ref 135–145)
Total Bilirubin: 1.1 mg/dL (ref 0.3–1.2)
Total Protein: 4.6 g/dL — ABNORMAL LOW (ref 6.5–8.1)

## 2018-08-19 NOTE — Progress Notes (Signed)
Subjective: 1 Day Post-Op Procedure(s) (LRB): OPEN REDUCTION INTERNAL FIXATION (ORIF) ANKLE FRACTURE (Right)    Patient reports pain as mild.  Objective:   VITALS:   Vitals:   08/19/18 0750 08/19/18 1025  BP: 95/70 (!) 99/59  Pulse: 80 63  Resp:    Temp: 97.6 F (36.4 C)   SpO2: 93%     Neurologically intact ABD soft Neurovascular intact Sensation intact distally Intact pulses distally Dorsiflexion/Plantar flexion intact moderate bleeding on dressing  LABS Recent Labs    08/18/18 0646  HGB 15.5*  HCT 47.6*  WBC 5.4  PLT 144*    Recent Labs    08/18/18 0646 08/19/18 0258  NA 140 137  K 3.6 4.0  BUN 14 11  CREATININE 0.52 0.57  GLUCOSE 107* 129*    Recent Labs    08/18/18 0646  INR 0.85     Assessment/Plan: 1 Day Post-Op Procedure(s) (LRB): OPEN REDUCTION INTERNAL FIXATION (ORIF) ANKLE FRACTURE (Right)   Advance diet Up with therapy D/C IV fluids Discharge to SNF tomorrow  Will apply cast tomorrow  RTC 2 weeks

## 2018-08-19 NOTE — Clinical Social Work Note (Signed)
Clinical Social Work Assessment  Patient Details  Name: Alyssa Holloway MRN: [id number] Date of Birth: Jun 22, 1921  Date of referral:  08/19/18               Reason for consult:  Other (Comment Required)(From [employer]. )                Permission sought to share information with:  Chartered certified accountant granted to share information::  Yes, Verbal Permission Granted  Name::      Geophysicist/field seismologist SNF STR   Agency::     Relationship::     Contact Information:     Housing/Transportation Living arrangements for the past 2 months:  [place], [place] of Information:  Patient, Adult Children Patient Interpreter Needed:  None Criminal Activity/Legal Involvement Pertinent to Current Situation/Hospitalization:  No - Comment as needed Significant Relationships:  Adult Children Lives with:  Facility Resident Do you feel safe going back to the place where you live?  Yes Need for family participation in patient care:  Yes (Comment)  Care giving concerns:  Patient is a short term rehab resident at [facility].    Social Worker assessment / plan:  Holiday representative ([place]) reviewed chart and noted that patient is from [facility]. Per [place] admissions coordinator at [facility] patient can return when stable and she received [facility] SNF authorization. CSW met with patient and her 2 daughters Alyssa Holloway and Alyssa Holloway were at bedside. They are agreeable for patient to return to [facility]. Per Dr. Ammie Ferrier note today plan is for patient to D/C back to [facility] tomorrow. Daughters requested [facility] for transport and stated they will pay out of pocket for it. CSW arranged [facility] transport for 4 pm tomorrow. [place] admissions coordinator at [facility] is aware of above. CSW will continue to follow and assist as needed.   Employment status:  Retired Office manager PT Recommendations:  [place] / Referral to community resources:  [place]  Patient/Family's Response to care:  Patient is agreeable to return to [facility].   Patient/Family's Understanding of and Emotional Response to Diagnosis, Current Treatment, and Prognosis: Patient and her daughters were very pleasant and thanked CSW for assistance.   Emotional Assessment Appearance:  Appears stated age Attitude/Demeanor/Rapport:    Affect (typically observed):  Accepting, Adaptable, Pleasant Orientation:  Oriented to Self, Oriented to Place, Oriented to  Time, Oriented to Situation Alcohol / Substance use:  Not Applicable Psych involvement (Current and /or in the community):  No (Comment)  Discharge Needs  Concerns to be addressed:  Discharge Planning Concerns Readmission within the last 30 days:  No Current discharge risk:  Dependent with Mobility Barriers to Discharge:  Continued Medical Work up   [employer], Veronia Beets, LCSW 08/19/2018, 4:57 PM

## 2018-08-19 NOTE — Evaluation (Signed)
Physical Therapy Evaluation Patient Details Name: Alyssa Holloway MRN: [id number] DOB: 1921-04-23 Today's Date: 08/19/2018   History of Present Illness  Pt is a 84 y/o F s/p R ankle ORIF due to recent fall.  Pt with recent episodes of syncope suspected to be due to bradycardia with sinus pauses.  Pt's PMH includes dementia.      Clinical Impression  Patient is s/p above surgery resulting in functional limitations due to the deficits listed below (see PT Problem List). Alyssa Holloway was very pleasant and agreeable to therapy.  She currently requires up to min assist for bed mobility and mod assist for sit>stand from elevated bed.  Attempted to initiate pivot to chair but pt unable at this time due to instability and pt unable to adhere to NWB RLE with attempt.  Given pt's current mobility status, recommending SNF at d/c.   Patient will benefit from skilled PT to increase their independence and safety with mobility to allow discharge to the venue listed below.      Follow Up Recommendations SNF    Equipment Recommendations  Other (comment)(TBD at next venue of care)    Recommendations for Other Services       Precautions / Restrictions Precautions Precautions: Fall;Other (comment) Precaution Comments: recent bradycardia (down into the 40s) with sinus pauses and syncope Restrictions Weight Bearing Restrictions: Yes RLE Weight Bearing: Non weight bearing      Mobility  Bed Mobility Overal bed mobility: Needs Assistance Bed Mobility: Supine to Sit;Sit to Supine     Supine to sit: Min guard;HOB elevated Sit to supine: Min assist   General bed mobility comments: Increased time and effort with HOB elevated for supine>sit.  To return to supine the pt requires assist to bring RLE into bed.   Transfers Overall transfer level: Needs assistance Equipment used: Rolling walker (2 wheeled) Transfers: Sit to/from Stand Sit to Stand: Mod assist;From elevated surface         General  transfer comment: Pt unable to stand from regular height bed so bed elevated.  Pt requires assist to boost to standing.  Stood x2 from bed with cues for hand placement and L foot placement for greater mechanical advantage.  Attempted to shuffle L foot for stand pivot initiation but pt unable due to unsteadiness and begins TDWB with attempt, thus discontinued.   Ambulation/Gait             General Gait Details: Unable to attempt at this time.   Stairs            Wheelchair Mobility    Modified Rankin (Stroke Patients Only)       Balance Overall balance assessment: Needs assistance;History of Falls Sitting-balance support: Single extremity supported;Feet supported(L foot only, supported) Sitting balance-Leahy Scale: Poor Sitting balance - Comments: Pt relies on at least 1UE support to sit EOB with RLE dangling   Standing balance support: Bilateral upper extremity supported;During functional activity Standing balance-Leahy Scale: Poor Standing balance comment: Pt relies on BUE support on RW and min>mod assist to maintain static standing balance                             Pertinent Vitals/Pain Pain Assessment: No/denies pain(no sign of pain)    Home Living Family/patient expects to be discharged to:: Private residence Living Arrangements: Alone Available Help at Discharge: Family Type of Home: House Home Access: Stairs to enter Entrance Stairs-Rails: Left Entrance Stairs-Number of Steps:  3 Home Layout: One level Home Equipment: Grab bars - tub/shower;Shower seat - built in Additional Comments: Unsure of reliability of information provided as no family present    Prior Function Level of Independence: Needs assistance   Gait / Transfers Assistance Needed: Prior to ankle fx the pt was living alone and ambulating without AD.  Pt doesn't drive.  Since ankle fx pt has been spending majority of her time in her WC.    ADL's / Homemaking Assistance Needed: Pt  ind with ADLs prior to ankle fx.  Since ankle fx pt likely has required some assist with ADLs.   Comments: Unsure of reliability of information provided as no family present     Hand Dominance        Extremity/Trunk Assessment   Upper Extremity Assessment Upper Extremity Assessment: Overall WFL for tasks assessed    Lower Extremity Assessment Lower Extremity Assessment: RLE deficits/detail RLE Deficits / Details: Unable to formally assess s/p surgery but pt able to perform SLR without assist RLE: Unable to fully assess due to immobilization       Communication   Communication: No difficulties  Cognition Arousal/Alertness: Awake/alert Behavior During Therapy: WFL for tasks assessed/performed Overall Cognitive Status: History of cognitive impairments - at baseline                                        General Comments General comments (skin integrity, edema, etc.): Pt reported "woozy feeling" in standing but VSS throughout session.     Exercises     Assessment/Plan    PT Assessment Patient needs continued PT services  PT Problem List Decreased strength;Decreased range of motion;Decreased activity tolerance;Decreased balance;Decreased mobility;Decreased cognition;Decreased knowledge of use of DME;Decreased safety awareness;Decreased knowledge of precautions;Pain       PT Treatment Interventions DME instruction;Gait training;Stair training;Functional mobility training;Therapeutic activities;Therapeutic exercise;Balance training;Neuromuscular re-education;Modalities;Wheelchair mobility training;Patient/family education;Cognitive remediation    PT Goals (Current goals can be found in the Care Plan section)  Acute Rehab PT Goals Patient Stated Goal: to return to PLOF PT Goal Formulation: With patient Time For Goal Achievement: 09/02/18 Potential to Achieve Goals: Fair    Frequency BID   Barriers to discharge Decreased caregiver support Pt lives alone     Co-evaluation               AM-PAC PT "6 Clicks" Mobility  Outcome Measure Help needed turning from your back to your side while in a flat bed without using bedrails?: A Little Help needed moving from lying on your back to sitting on the side of a flat bed without using bedrails?: A Little Help needed moving to and from a bed to a chair (including a wheelchair)?: Total Help needed standing up from a chair using your arms (e.g., wheelchair or bedside chair)?: A Lot Help needed to walk in hospital room?: Total Help needed climbing 3-5 steps with a railing? : Total 6 Click Score: 11    End of Session Equipment Utilized During Treatment: Gait belt Activity Tolerance: Patient limited by fatigue Patient left: in bed;with call bell/phone within reach;with bed alarm set;with family/visitor present;Other (comment)(with RLE elevated using pillows) Nurse Communication: Mobility status PT Visit Diagnosis: Unsteadiness on feet (R26.81);Difficulty in walking, not elsewhere classified (R26.2);Other abnormalities of gait and mobility (R26.89);Muscle weakness (generalized) (M62.81)    Time: 0865-7846 PT Time Calculation (min) (ACUTE ONLY): 40 min   Charges:  PT Evaluation $PT Eval Moderate Complexity: 1 Mod PT Treatments $Therapeutic Activity: 23-37 mins        Session was performed by student PT, Belva Crome, and directed, overseen, and documented by this PT.  Collie Siad PT, DPT 08/19/2018, 11:35 AM

## 2018-08-19 NOTE — Progress Notes (Signed)
Physical Therapy Treatment Patient Details Name: Alyssa Holloway MRN: [id number] DOB: 06-28-1921 Today's Date: 08/19/2018    History of Present Illness Pt is a 84 y/o F s/p R ankle ORIF due to recent fall.  Pt with recent episodes of syncope suspected to be due to bradycardia with sinus pauses.  Pt's PMH includes dementia.      PT Comments    Ms. Mastro made good progress toward therapy goals but continues to require assist with all aspects of mobility.  Pt performed sit>stand x2 from an elevated bed surface and performed heel raises as a pre-gait activity, requiring assist and cues to maintain NWB RLE. SNF remains most appropriate d/c plan at this time.    Follow Up Recommendations  SNF     Equipment Recommendations  Other (comment)(TBD at next venue of care)    Recommendations for Other Services       Precautions / Restrictions Precautions Precautions: Fall;Other (comment) Precaution Comments: recent bradycardia (down into the 40s) with sinus pauses and syncope Restrictions Weight Bearing Restrictions: Yes RLE Weight Bearing: Non weight bearing    Mobility  Bed Mobility Overal bed mobility: Needs Assistance Bed Mobility: Supine to Sit;Sit to Supine     Supine to sit: HOB elevated;Min assist Sit to supine: Min assist   General bed mobility comments: Assist for management of RLE with increased time and effort and with use of bed rail  Transfers Overall transfer level: Needs assistance Equipment used: Rolling walker (2 wheeled) Transfers: Sit to/from Stand Sit to Stand: Mod assist;From elevated surface         General transfer comment: Assist to boost to standing with cues for proper technique.  Assist for controlled descent to sit.   Ambulation/Gait             General Gait Details: Unable to attempt at this time.    Stairs             Wheelchair Mobility    Modified Rankin (Stroke Patients Only)       Balance Overall balance assessment:  Needs assistance;History of Falls Sitting-balance support: Single extremity supported;Feet supported(L foot only, supported) Sitting balance-Leahy Scale: Poor Sitting balance - Comments: Pt relies on at least 1UE support to sit EOB with RLE dangling   Standing balance support: Bilateral upper extremity supported;During functional activity Standing balance-Leahy Scale: Poor Standing balance comment: Pt relies on BUE support on RW and min>mod assist to maintain static standing balance                            Cognition Arousal/Alertness: Awake/alert Behavior During Therapy: WFL for tasks assessed/performed Overall Cognitive Status: History of cognitive impairments - at baseline                                        Exercises Other Exercises Other Exercises: L heel lifts in standing with BUE support and mod assist to remain steady 2x6.  Verbal cues to adhere to NWB RLE.     General Comments        Pertinent Vitals/Pain Pain Assessment: Faces Faces Pain Scale: Hurts little more Pain Location: RLE Pain Descriptors / Indicators: Discomfort;Grimacing Pain Intervention(s): Limited activity within patient's tolerance;Monitored during session    Home Living  Prior Function            PT Goals (current goals can now be found in the care plan section) Acute Rehab PT Goals Patient Stated Goal: to return to PLOF PT Goal Formulation: With patient Time For Goal Achievement: 09/02/18 Potential to Achieve Goals: Fair Progress towards PT goals: Progressing toward goals    Frequency    BID      PT Plan Current plan remains appropriate    Co-evaluation              AM-PAC PT "6 Clicks" Mobility   Outcome Measure  Help needed turning from your back to your side while in a flat bed without using bedrails?: A Little Help needed moving from lying on your back to sitting on the side of a flat bed without using  bedrails?: A Little Help needed moving to and from a bed to a chair (including a wheelchair)?: Total Help needed standing up from a chair using your arms (e.g., wheelchair or bedside chair)?: A Lot Help needed to walk in hospital room?: Total Help needed climbing 3-5 steps with a railing? : Total 6 Click Score: 11    End of Session Equipment Utilized During Treatment: Gait belt Activity Tolerance: Patient limited by fatigue Patient left: in bed;with call bell/phone within reach;with bed alarm set;with family/visitor present;Other (comment)(with RLE elevated using pillows) Nurse Communication: Mobility status;Patient requests pain meds PT Visit Diagnosis: Unsteadiness on feet (R26.81);Difficulty in walking, not elsewhere classified (R26.2);Other abnormalities of gait and mobility (R26.89);Muscle weakness (generalized) (M62.81)     Time: 0623-7628 PT Time Calculation (min) (ACUTE ONLY): 31 min  Charges:  $Therapeutic Exercise: 8-22 mins $Therapeutic Activity: 8-22 mins                     Session was performed by student PT, Belva Crome, and directed, overseen, and documented by this PT.  Collie Siad PT, DPT 08/19/2018, 3:21 PM

## 2018-08-20 LAB — COMPREHENSIVE METABOLIC PANEL
ALT: 8 U/L (ref 0–44)
AST: 13 U/L — ABNORMAL LOW (ref 15–41)
Albumin: 3 g/dL — ABNORMAL LOW (ref 3.5–5.0)
Alkaline Phosphatase: 56 U/L (ref 38–126)
Anion gap: 4 — ABNORMAL LOW (ref 5–15)
BILIRUBIN TOTAL: 1.1 mg/dL (ref 0.3–1.2)
BUN: 8 mg/dL (ref 8–23)
CO2: 34 mmol/L — ABNORMAL HIGH (ref 22–32)
Calcium: 8.4 mg/dL — ABNORMAL LOW (ref 8.9–10.3)
Chloride: 102 mmol/L (ref 98–111)
Creatinine, Ser: 0.56 mg/dL (ref 0.44–1.00)
GFR calc Af Amer: >60 mL/min (ref >60)
GFR calc non Af Amer: >60 mL/min (ref >60)
Glucose, Bld: 103 mg/dL — ABNORMAL HIGH (ref 70–99)
POTASSIUM: 3.6 mmol/L (ref 3.5–5.1)
Sodium: 140 mmol/L (ref 135–145)
Total Protein: 5.3 g/dL — ABNORMAL LOW (ref 6.5–8.1)

## 2018-08-20 MED ORDER — TRAMADOL-ACETAMINOPHEN 37.5-325 MG PO TABS: 1.0000 | ORAL_TABLET | Freq: Four times a day (QID) | ORAL | 0 refills | Status: AC | PRN

## 2018-08-20 MED ORDER — ASPIRIN EC 325 MG PO TBEC: 325.0000 mg | DELAYED_RELEASE_TABLET | Freq: Every day | ORAL | 0 refills | Status: AC

## 2018-08-20 NOTE — Discharge Summary (Signed)
Physician Discharge Summary  Patient ID: Alyssa Holloway MRN: [id number] DOB/AGE: April 22, 1921 84 y.o.  Admit date: 08/18/2018 Discharge date: 08/20/2018  Admission Diagnoses: Displaced right trimalleolar ankle fracture  Discharge Diagnoses: Same  Discharged Condition: good  Hospital Course: The patient underwent open reduction internal fixation of the displaced right trimalleolar ankle fracture Monday 08/18/18.  Fracture was about 51 weeks old and surgery had to be delayed due to poor skin condition is on the medial side of the ankle.  Excellent reduction was obtained.  Postoperatively the patient did well.  Dressings were changed and short leg cast applied 08/20/2018.  She is stable and discharged to skilled nursing for rehab.  She will be followed up in 2 weeks in my office.  Consults: None  Significant Diagnostic Studies: radiology: Satisfactory postop right ankle repair  Treatments: antibiotics: Ancef  Discharge Exam: Blood pressure 113/80, pulse 73, temperature 98.3 F (36.8 C), temperature source Oral, resp. rate 17, height 5\' 5"  (1.651 m), weight 59.6 kg, SpO2 93 %.    Incision/Wound: Wound was benign with dressings changed and short leg cast applied.  Neurovascular status good distally.  Disposition: Discharge disposition: 01-Home or Self Care       Discharge Instructions    Call MD for:  persistant nausea and vomiting   Complete by:  As directed    Call MD for:  redness, tenderness, or signs of infection (pain, swelling, redness, odor or green/yellow discharge around incision site)   Complete by:  As directed    Call MD for:  severe uncontrolled pain   Complete by:  As directed    Call MD for:  temperature >100.4   Complete by:  As directed    Diet - low sodium heart healthy   Complete by:  As directed    Discharge instructions   Complete by:  As directed    Elevate operative leg on 2-3 pillows Apply ice as needed RTC as appointed in 2 weeks  NO WEIGHT ON RIGHT  LEG   Increase activity slowly   Complete by:  As directed    May walk on heel   Leave dressing on - Keep it clean, dry, and intact until clinic visit   Complete by:  As directed    Non weight bearing   Complete by:  As directed    Laterality:  left   Extremity:  Lower     Allergies as of 08/20/2018      Reactions   Vicodin [hydrocodone-acetaminophen]       Medication List    STOP taking these medications   aspirin 81 MG tablet Replaced by:  aspirin EC 325 MG tablet     TAKE these medications   acetaminophen 500 MG tablet Commonly known as:  TYLENOL Take 1,000 mg by mouth every 8 (eight) hours.   amLODipine 5 MG tablet Commonly known as:  NORVASC Take 10 mg by mouth daily.   ascorbic acid 1000 MG tablet Commonly known as:  VITAMIN C Take 1,000 mg by mouth daily.   aspirin EC 325 MG tablet Take 1 tablet (325 mg total) by mouth daily. Replaces:  aspirin 81 MG tablet   benazepril 20 MG tablet Commonly known as:  LOTENSIN Take 20 mg by mouth daily.   cholecalciferol 1000 units tablet Commonly known as:  VITAMIN D Take 1,000 Units by mouth daily.   donepezil 10 MG tablet Commonly known as:  ARICEPT Take 10 mg by mouth at bedtime.   Fish Oil 1000  MG Caps Take 1,000 mg by mouth daily.   furosemide 20 MG tablet Commonly known as:  LASIX Take 1 tablet by mouth daily.   levothyroxine 125 MCG tablet Commonly known as:  SYNTHROID, LEVOTHROID Take 112 mcg by mouth daily.   lidocaine 5 % ointment Commonly known as:  XYLOCAINE Apply 1 application topically 3 (three) times daily as needed.   multivitamin tablet Take 1 tablet by mouth daily.   traMADol-acetaminophen 37.5-325 MG tablet Commonly known as:  ULTRACET Take 1 tablet by mouth every 6 (six) hours as needed.            Durable Medical Equipment  (From admission, onward)         Start     Ordered   08/20/18 1244  DME 3-in-1  Once    Comments:  Foot surgery   08/20/18 1249   08/18/18 1338   DME Walker rolling  Once    Question:  Patient needs a walker to treat with the following condition  Answer:  Trimalleolar fracture of ankle, closed, right, initial encounter   08/18/18 1338           Discharge Care Instructions  (From admission, onward)         Start     Ordered   08/20/18 0000  Non weight bearing    Question Answer Comment  Laterality left   Extremity Lower      08/20/18 1249          Contact information for follow-up providers    Earnestine Leys, MD Follow up in 2 week(s).   Specialty:  Orthopedic Surgery Contact information: [place] [address] [phone]            Contact information for after-discharge care    [facility] .   Service:  Skilled Nursing Contact information: [place] [phone]                  Signed: KEIOSHA, CANCRO 08/20/2018, 12:50 PM

## 2018-08-20 NOTE — Progress Notes (Signed)
Physical Therapy Treatment Patient Details Name: Alyssa Holloway MRN: [id number] DOB: 02/15/1921 Today's Date: 08/20/2018    History of Present Illness Pt is a 84 y/o F s/p R ankle ORIF due to recent fall.  Pt with recent episodes of syncope suspected to be due to bradycardia with sinus pauses.  Pt's PMH includes dementia.      PT Comments    [name] was very pleasant and put forth good effort throughout session.  Pt stood from bed x2 with light mod assist but did require up to min>mod assist to remain steady in standing.  Pt participated in pre-gait activities standing at bedside and remains unable to safely attempt stand pivot transfer while maintaining NWB RLE.  SNF remains most appropriate d/c plan at this time.    Follow Up Recommendations  SNF     Equipment Recommendations  Other (comment)(TBD at next venue of care)    Recommendations for Other Services       Precautions / Restrictions Precautions Precautions: Fall;Other (comment) Precaution Comments: recent bradycardia (down into the 40s) with sinus pauses and syncope Restrictions Weight Bearing Restrictions: Yes RLE Weight Bearing: Non weight bearing    Mobility  Bed Mobility Overal bed mobility: Needs Assistance Bed Mobility: Supine to Sit;Sit to Supine     Supine to sit: HOB elevated;Min guard Sit to supine: Min assist   General bed mobility comments: Pt requires increased time and effort with use of bed rail.  To return to supine pt requires assist to bring RLE into bed.   Transfers Overall transfer level: Needs assistance Equipment used: Rolling walker (2 wheeled) Transfers: Sit to/from Stand Sit to Stand: Mod assist;From elevated surface         General transfer comment: Light mod assist to boost to standing and to remain steady.  Cues for proper hand placement with verbal and tactile cues.  L foot blocked to prevent L foot from sliding.  Pt stood x2 from bed.   Ambulation/Gait              General Gait Details: Unable to attempt at this time.    Stairs             Wheelchair Mobility    Modified Rankin (Stroke Patients Only)       Balance Overall balance assessment: Needs assistance;History of Falls Sitting-balance support: Single extremity supported;Feet supported(L foot only, supported) Sitting balance-Leahy Scale: Poor Sitting balance - Comments: Pt able to sit EOB without UE support intermittently.    Standing balance support: Bilateral upper extremity supported;During functional activity Standing balance-Leahy Scale: Poor Standing balance comment: Pt relies on BUE support on RW and min>mod assist to maintain static standing balance                            Cognition Arousal/Alertness: Awake/alert Behavior During Therapy: WFL for tasks assessed/performed Overall Cognitive Status: History of cognitive impairments - at baseline                                        Exercises Other Exercises Other Exercises: L heel lifts in standing with BUE support and mod assist to remain steady x6.  Verbal cues to adhere to NWB RLE.  Other Exercises: Attempted to shuffle L foot as a pre-gait activity but pt has difficulty with this and ultimately was unsuccessful  General Comments General comments (skin integrity, edema, etc.): Pt reports feeling "woozy" in sitting and standing but this resolves in supine. SpO2 and HR stable throughout session.       Pertinent Vitals/Pain Pain Assessment: No/denies pain Pain Score: 0-No pain Faces Pain Scale: No hurt Pain Intervention(s): Monitored during session    Home Living                      Prior Function            PT Goals (current goals can now be found in the care plan section) Acute Rehab PT Goals Patient Stated Goal: to return to PLOF PT Goal Formulation: With patient Time For Goal Achievement: 09/02/18 Potential to Achieve Goals: Fair Progress towards PT goals:  Progressing toward goals    Frequency    BID      PT Plan Current plan remains appropriate    Co-evaluation              AM-PAC PT "6 Clicks" Mobility   Outcome Measure  Help needed turning from your back to your side while in a flat bed without using bedrails?: A Little Help needed moving from lying on your back to sitting on the side of a flat bed without using bedrails?: A Little Help needed moving to and from a bed to a chair (including a wheelchair)?: Total Help needed standing up from a chair using your arms (e.g., wheelchair or bedside chair)?: A Lot Help needed to walk in hospital room?: Total Help needed climbing 3-5 steps with a railing? : Total 6 Click Score: 11    End of Session Equipment Utilized During Treatment: Gait belt Activity Tolerance: Patient limited by fatigue Patient left: in bed;with call bell/phone within reach;with bed alarm set;Other (comment)(with RLE elevated using pillows) Nurse Communication: Mobility status PT Visit Diagnosis: Unsteadiness on feet (R26.81);Difficulty in walking, not elsewhere classified (R26.2);Other abnormalities of gait and mobility (R26.89);Muscle weakness (generalized) (M62.81)     Time: 4540-9811 PT Time Calculation (min) (ACUTE ONLY): 25 min  Charges:  $Therapeutic Activity: 23-37 mins                     Collie Siad PT, DPT 08/20/2018, 10:26 AM

## 2018-08-20 NOTE — Progress Notes (Signed)
Subjective: 2 Days Post-Op Procedure(s) (LRB): OPEN REDUCTION INTERNAL FIXATION (ORIF) ANKLE FRACTURE (Right)    Patient reports pain as mild.  Dressings changed and short leg cast applied.  Wounds were benign.  Objective:   VITALS:   Vitals:   08/20/18 0738 08/20/18 1015  BP: (!) 133/92 113/80  Pulse: 73   Resp: 17   Temp: 98.3 F (36.8 C)   SpO2: 93%     Neurologically intact ABD soft Neurovascular intact Sensation intact distally Intact pulses distally Dorsiflexion/Plantar flexion intact Incision: dressing C/D/I  LABS Recent Labs    08/18/18 0646  HGB 15.5*  HCT 47.6*  WBC 5.4  PLT 144*    Recent Labs    08/18/18 0646 08/19/18 0258 08/20/18 0540  NA 140 137 140  K 3.6 4.0 3.6  BUN 14 11 8   CREATININE 0.52 0.57 0.56  GLUCOSE 107* 129* 103*    Recent Labs    08/18/18 0646  INR 0.85     Assessment/Plan: 2 Days Post-Op Procedure(s) (LRB): OPEN REDUCTION INTERNAL FIXATION (ORIF) ANKLE FRACTURE (Right)   Enteric-coated aspirin 325 mg twice daily Nonweightbearing right leg Return to clinic 2 weeks

## 2018-08-20 NOTE — Progress Notes (Signed)
Report called and given to Telia at [facility]. IV removed. Will assist pt with getting dressed. [place] will pick up pt to transport at 4pm.

## 2018-08-20 NOTE — Progress Notes (Signed)
Patient will D/C back to [facility] today. Per [place] admissions coordinator at [facility] authorization has been received and patient can return today. RN will call report and [facility] wheel chair Luc[facility] Lei transport will pick up at 4 pm today. [facility]J driver will call 1A nurses station when he is close to the visitors entrance at [facility]. Patient has a wheel chair from [facility] in her room that she will take back with her. Clinical Education officer, museum (CSW) sent D/C orders to [facility] via [facility]. Patient's daughters Hilda Blades and [place] requested [facility] transport yesterday. Patient is aware of above. CSW left patient's daughter Hilda Blades a Advertising account executive. CSW attempted to contact patient's other daughter Lovey Newcomer however she did not answer and her voicemail box was full. Please reconsult if future social work needs arise. CSW signing off.   [employer], LCSW [phone]

## 2020-02-06 ENCOUNTER — Emergency Department: Payer: Medicare Other

## 2020-02-06 ENCOUNTER — Encounter: Payer: Self-pay | Admitting: Emergency Medicine

## 2020-02-06 ENCOUNTER — Emergency Department
Admission: EM | Admit: 2020-02-06 | Discharge: 2020-02-07 | Disposition: A | Discharge status: Skilled Nursing Facility | Payer: Medicare Other | Attending: Emergency Medicine | Admitting: Emergency Medicine

## 2020-02-06 ENCOUNTER — Other Ambulatory Visit: Payer: Self-pay

## 2020-02-06 DIAGNOSIS — S61012A Laceration without foreign body of left thumb without damage to nail, initial encounter: Secondary | ICD-10-CM

## 2020-02-06 DIAGNOSIS — Y92129 Unspecified place in nursing home as the place of occurrence of the external cause: Secondary | ICD-10-CM | POA: Insufficient documentation

## 2020-02-06 DIAGNOSIS — S99922A Unspecified injury of left foot, initial encounter: Secondary | ICD-10-CM | POA: Diagnosis not present

## 2020-02-06 DIAGNOSIS — Y9389 Activity, other specified: Secondary | ICD-10-CM | POA: Insufficient documentation

## 2020-02-06 DIAGNOSIS — I10 Essential (primary) hypertension: Secondary | ICD-10-CM | POA: Diagnosis not present

## 2020-02-06 DIAGNOSIS — G309 Alzheimer's disease, unspecified: Secondary | ICD-10-CM | POA: Diagnosis not present

## 2020-02-06 DIAGNOSIS — W19XXXA Unspecified fall, initial encounter: Secondary | ICD-10-CM

## 2020-02-06 DIAGNOSIS — Z79899 Other long term (current) drug therapy: Secondary | ICD-10-CM | POA: Diagnosis not present

## 2020-02-06 DIAGNOSIS — W1839XA Other fall on same level, initial encounter: Secondary | ICD-10-CM | POA: Diagnosis not present

## 2020-02-06 DIAGNOSIS — Y999 Unspecified external cause status: Secondary | ICD-10-CM | POA: Diagnosis not present

## 2020-02-06 DIAGNOSIS — M79672 Pain in left foot: Secondary | ICD-10-CM | POA: Insufficient documentation

## 2020-02-06 MED ORDER — ACETAMINOPHEN 500 MG PO TABS
1000.0000 mg | ORAL_TABLET | Freq: Once | ORAL | Status: AC
  Administered 2020-02-06: 1000 mg via ORAL
  Filled 2020-02-06: qty 2

## 2020-02-06 MED ORDER — LIDOCAINE-EPINEPHRINE 2 %-1:100000 IJ SOLN
20.0000 mL | Freq: Once | INTRAMUSCULAR | Status: DC
  Filled 2020-02-06: qty 1

## 2020-02-06 NOTE — ED Provider Notes (Signed)
[facility] Emergency Department Provider Note  ____________________________________________   First MD Initiated Contact with Patient 02/06/20 1953     (approximate)  I have reviewed the triage vital signs and the nursing notes.   HISTORY  Chief Complaint Fall    HPI Alyssa Holloway is a 86 y.o. female with Alzheimer's who comes in for fall.  Per EMS patient had a witnessed fall and has a cut on the left hand and some pain on the left foot.  Unclear if patient hit her head but probably not.  Patient herself is not sure.  Unable to get full HPI from patient due to her baseline dementia.  I did call the facility and patient was at her baseline self but she was walking without her walker and she tried to grab onto the door and slid down onto the floor.             Past Medical History:  Diagnosis Date  . Anxiety   . Dementia ([place])    [place]  . Diabetes mellitus without complication (HCC)    DIET CONTROLLED  . Fibrocystic breast disease   . Hemorrhoid   . Hyperlipidemia   . Hypertension   . Hypothyroidism   . Malignant neoplasm of upper-outer quadrant of female breast ([place]) August 25, 2003   Invasive ductal carcinoma, T2, N0.   ER/PR negative, HER-2/neu 3+.  . Rectal bleeding   . Thrombocytopenia ([place])   . Thyroid disease     Patient Active Problem List   Diagnosis Date Noted  . Trimalleolar fracture of ankle, closed, right, initial encounter 08/18/2018  . Bradycardia 08/13/2018  . Internal hemorrhoid 05/11/2014    Past Surgical History:  Procedure Laterality Date  . APPENDECTOMY    . BREAST SURGERY Right    lumpectomy  . CATARACT EXTRACTION W/PHACO Left 03/21/2017   Procedure: CATARACT EXTRACTION PHACO AND INTRAOCULAR LENS PLACEMENT (IOC);  Surgeon: Leandrew Koyanagi, MD;  Location: [facility] ORS;  Service: Ophthalmology;  Laterality: Left;  [place] 1:38.9 AP% 24.4 CDE 24.10 Fluid pack lot # [id number]  . HEMORRHOID BANDING  August  2015  . ORIF ANKLE FRACTURE Right 08/18/2018   Procedure: OPEN REDUCTION INTERNAL FIXATION (ORIF) ANKLE FRACTURE;  Surgeon: Earnestine Leys, MD;  Location: [facility] ORS;  Service: Orthopedics;  Laterality: Right;  . STAPLE HEMORRHOIDECTOMY  April 09, 2006  . THYROID SURGERY      Prior to Admission medications   Medication Sig Start Date End Date Taking? Authorizing Provider  acetaminophen (TYLENOL) 500 MG tablet Take 1,000 mg by mouth every 8 (eight) hours.    [provider]  amLODipine (NORVASC) 5 MG tablet Take 10 mg by mouth daily.     [provider]  ascorbic acid (VITAMIN C) 1000 MG tablet Take 1,000 mg by mouth daily.    [provider]  aspirin EC 325 MG tablet Take 1 tablet (325 mg total) by mouth daily. 08/20/18   Earnestine Leys, MD  benazepril (LOTENSIN) 20 MG tablet Take 20 mg by mouth daily.     [provider]  cholecalciferol (VITAMIN D) 1000 UNITS tablet Take 1,000 Units by mouth daily.    [provider]  donepezil (ARICEPT) 10 MG tablet Take 10 mg by mouth at bedtime.    [provider]  furosemide (LASIX) 20 MG tablet Take 1 tablet by mouth daily.  03/22/14   [provider]  levothyroxine (SYNTHROID, LEVOTHROID) 125 MCG tablet Take 112 mcg by mouth daily.  03/22/14   [provider]  lidocaine (XYLOCAINE) 5 % ointment Apply 1 application topically 3 (three) times daily as needed. 05/26/14   Robert Bellow, MD  Multiple Vitamin (MULTIVITAMIN) tablet Take 1 tablet by mouth daily.    [provider]  Omega-3 Fatty Acids (FISH OIL) 1000 MG CAPS Take 1,000 mg by mouth daily.     [provider]  traMADol-acetaminophen (ULTRACET) 37.5-325 MG tablet Take 1 tablet by mouth every 6 (six) hours as needed. 08/20/18   Earnestine Leys, MD    Allergies Vicodin [hydrocodone-acetaminophen]  History reviewed. No pertinent family history.  Social History Social History   Tobacco Use  . Smoking status:  Never Smoker  . Smokeless tobacco: Never Used  Substance Use Topics  . Alcohol use: Yes    Comment: GLASS OF WINE ON THURSDAYS  . Drug use: No      Review of Systems Unable to get full review of systems due to patient's baseline dementia ____________________________________________   PHYSICAL EXAM:  VITAL SIGNS: ED Triage Vitals  Enc Vitals Group     BP 02/06/20 2005 140/90     Pulse Rate 02/06/20 2005 90     Resp 02/06/20 2005 18     Temp --      Temp src --      SpO2 02/06/20 2005 98 %     Weight 02/06/20 2006 131 lb 6.3 oz (59.6 kg)     Height 02/06/20 2006 _0  (1.676 m)     Head Circumference --      Peak Flow --      Pain Score 02/06/20 2006 7     Pain Loc --      Pain Edu? --      Excl. in [place]? --     Constitutional: Alert   Eyes: Conjunctivae are normal. EOMI. Head: Atraumatic. Nose: No congestion/rhinnorhea. Mouth/Throat: Mucous membranes are moist.   Neck: No stridor. Trachea Midline. FROM Cardiovascular: Normal rate, regular rhythm. Grossly normal heart sounds.  Good peripheral circulation. No chest wall tenderness Respiratory: Normal respiratory effort.  No retractions. Lungs CTAB. Gastrointestinal: Soft and nontender. No distention. No abdominal bruits.  Musculoskeletal:   RUE: No point tenderness, deformity or other signs of injury. Radial pulse intact. Neuro intact. Full ROM in joint. LUE: cut on her left hand, on the inner aspect of her thumb.  No tendon visible.  Able to flex the finger and has sensation intact radial pulse intact. Neuro intact. Full ROM in joints RLE: No point tenderness, deformity or other signs of injury. DP pulse intact. Neuro intact. Full ROM in joints. LLE: Pain at the toe.  DP pulse intact. Neuro intact. Full ROM in joints. Neurologic:  Normal speech and language. No gross focal neurologic deficits are appreciated.  Skin:  Skin is warm, dry and intact. No rash noted. Psychiatric: Mood and affect are normal. Speech and  behavior are normal. GU: Deferred   ____________________________________________  RADIOLOGY Robert Bellow, personally viewed and evaluated these images (plain radiographs) as part of my medical decision making, as well as reviewing the written report by the radiologist.  ED MD interpretation: There were no signs of any fractures  Official radiology report(s): DG Ankle Complete Left  Result Date: 02/06/2020 CLINICAL DATA:  Pain status post fall EXAM: LEFT ANKLE COMPLETE - 3+ VIEW COMPARISON:  None. FINDINGS: There is no evidence of fracture, dislocation, or joint effusion. There is no evidence of arthropathy or other focal bone abnormality.  Soft tissues are unremarkable. IMPRESSION: Negative. Electronically Signed   By: Constance Holster M.D.   On: 02/06/2020 20:38   DG Chest Portable 1 View  Result Date: 02/06/2020 CLINICAL DATA:  Shortness of breath EXAM: PORTABLE CHEST 1 VIEW COMPARISON:  August 13, 2018 FINDINGS: The heart size is enlarged. Aortic calcifications are noted. There is prominence of the right perihilar region which is stable from prior study and may be secondary to dilated pulmonary vasculature. There is no pneumothorax. There is a stable calcified granuloma in the left upper lobe. There is some scarring and atelectasis at the lung bases. There is no acute osseous abnormality. IMPRESSION: No active disease. Electronically Signed   By: Constance Holster M.D.   On: 02/06/2020 20:34   DG Hand Complete Left  Result Date: 02/06/2020 CLINICAL DATA:  Pain status post fall EXAM: LEFT HAND - COMPLETE 3+ VIEW COMPARISON:  None. FINDINGS: There are advanced degenerative changes of the interphalangeal joints. There is osteopenia. There are advanced degenerative changes the first metatarsophalangeal joint. There is no acute displaced fracture. There is no dislocation. IMPRESSION: No acute osseous abnormality. Degenerative changes as detailed above. Electronically Signed   By:  Constance Holster M.D.   On: 02/06/2020 20:35   DG Foot Complete Left  Result Date: 02/06/2020 CLINICAL DATA:  Pain. EXAM: LEFT FOOT - COMPLETE 3+ VIEW COMPARISON:  None. FINDINGS: There is no evidence of fracture or dislocation. There is no evidence of arthropathy or other focal bone abnormality. Soft tissues are unremarkable. IMPRESSION: Negative. Electronically Signed   By: Constance Holster M.D.   On: 02/06/2020 20:36    ____________________________________________   PROCEDURES  Procedure(s) performed (including Critical Care):  Marland KitchenMarland KitchenLaceration Repair  Date/Time: 02/06/2020 10:58 PM Performed by: Vanessa [place], MD Authorized by: Vanessa [place], MD   Consent:    Consent obtained:  Verbal and emergent situation   Consent given by:  Patient   Risks discussed:  Infection, nerve damage, poor wound healing, poor cosmetic result, need for additional repair, pain, retained foreign body, tendon damage and vascular damage   Alternatives discussed:  No treatment Anesthesia (see MAR for exact dosages):    Anesthesia method:  Local infiltration   Local anesthetic:  Lidocaine 1% WITH epi Laceration details:    Location:  Finger   Finger location:  L thumb   Length (cm):  5   Depth (mm):  2 Repair type:    Repair type:  Simple Exploration:    Hemostasis achieved with:  Epinephrine and direct pressure   Wound exploration: wound explored through full range of motion and entire depth of wound probed and visualized     Wound extent: no areolar tissue violation noted, no fascia violation noted, no foreign bodies/material noted, no nerve damage noted and no tendon damage noted     Contaminated: no   Treatment:    Area cleansed with:  Saline and Betadine   Amount of cleaning:  Standard Mucous membrane repair:    Suture size:  6-0   Number of sutures:  4 Post-procedure details:    Dressing:  Antibiotic ointment and sterile dressing   Patient tolerance of procedure:  Tolerated well, no  immediate complications     ____________________________________________   INITIAL IMPRESSION / ASSESSMENT AND PLAN / ED COURSE  Loeta B Helin was evaluated in Emergency Department on 02/06/2020 for the symptoms described in the history of present illness. She was evaluated in the context of the global COVID-19 pandemic, which necessitated consideration  that the patient might be at risk for infection with the SARS-CoV-2 virus that causes COVID-19. Institutional protocols and algorithms that pertain to the evaluation of patients at risk for COVID-19 are in a state of rapid change based on information released by regulatory bodies including the CDC and federal and state organizations. These policies and algorithms were followed during the patient's care in the ED.    Patient had what sound like a mechanical fall that was witnessed.  Will get x-rays to evaluate for fractures, CT head and CT cervical evaluate for intracranial hemorrhage, cervical fracture.  According to facility she is at her baseline self eating and drinking therefore do not think we get labs.  CT imaging was negative, x-rays were negative.  Laceration on her thumb was repaired with 4 sutures.  Patient still able to flex the thumb after laceration repair and sensation was still intact.  Attempted to call the daughter to update her unable to get an answer.  Left a message.  I discussed the provisional nature of ED diagnosis, the treatment so far, the ongoing plan of care, follow up appointments and return precautions with the patient and any family or support people present. They expressed understanding and agreed with the plan, discharged home.        ____________________________________________   FINAL CLINICAL IMPRESSION(S) / ED DIAGNOSES   Final diagnoses:  Laceration of left thumb, foreign body presence unspecified, nail damage status unspecified, initial encounter  Fall, initial encounter      MEDICATIONS  GIVEN DURING THIS VISIT:  Medications  acetaminophen (TYLENOL) tablet 1,000 mg (has no administration in time range)     ED Discharge Orders    None       Note:  This document was prepared using Dragon voice recognition software and may include unintentional dictation errors.   Vanessa [place], MD 02/06/20 [phone]

## 2020-02-06 NOTE — Discharge Instructions (Signed)
We repaired the thumb laceration with sutures.  These need to be removed in 7 to 10 days.  You should keep an eye on the area and remove the dressing daily to make sure that there is not an infection starting.  If there is any redness that starts to form you should follow-up with her primary care doctor.  Return to the ER for any other concerns

## 2020-02-07 NOTE — ED Notes (Signed)
Pt suture site dressed with kerlex bandage. ACE bandage applied to pts right ankle. Pt educated on dressing change and wound care.

## 2020-03-02 ENCOUNTER — Emergency Department: Payer: Medicare Other

## 2020-03-02 ENCOUNTER — Encounter: Payer: Self-pay | Admitting: Emergency Medicine

## 2020-03-02 ENCOUNTER — Other Ambulatory Visit: Payer: Self-pay

## 2020-03-02 ENCOUNTER — Emergency Department
Admission: EM | Admit: 2020-03-02 | Discharge: 2020-03-02 | Disposition: A | Discharge status: Skilled Nursing Facility | Payer: Medicare Other | Attending: Student | Admitting: Student

## 2020-03-02 DIAGNOSIS — Z79899 Other long term (current) drug therapy: Secondary | ICD-10-CM | POA: Diagnosis not present

## 2020-03-02 DIAGNOSIS — Y939 Activity, unspecified: Secondary | ICD-10-CM | POA: Insufficient documentation

## 2020-03-02 DIAGNOSIS — E039 Hypothyroidism, unspecified: Secondary | ICD-10-CM | POA: Diagnosis not present

## 2020-03-02 DIAGNOSIS — Y929 Unspecified place or not applicable: Secondary | ICD-10-CM | POA: Diagnosis not present

## 2020-03-02 DIAGNOSIS — I10 Essential (primary) hypertension: Secondary | ICD-10-CM | POA: Diagnosis not present

## 2020-03-02 DIAGNOSIS — G309 Alzheimer's disease, unspecified: Secondary | ICD-10-CM | POA: Insufficient documentation

## 2020-03-02 DIAGNOSIS — E119 Type 2 diabetes mellitus without complications: Secondary | ICD-10-CM | POA: Diagnosis not present

## 2020-03-02 DIAGNOSIS — Z853 Personal history of malignant neoplasm of breast: Secondary | ICD-10-CM | POA: Diagnosis not present

## 2020-03-02 DIAGNOSIS — X58XXXA Exposure to other specified factors, initial encounter: Secondary | ICD-10-CM | POA: Diagnosis not present

## 2020-03-02 DIAGNOSIS — S79911A Unspecified injury of right hip, initial encounter: Secondary | ICD-10-CM | POA: Diagnosis present

## 2020-03-02 DIAGNOSIS — Y999 Unspecified external cause status: Secondary | ICD-10-CM | POA: Diagnosis not present

## 2020-03-02 DIAGNOSIS — Z7982 Long term (current) use of aspirin: Secondary | ICD-10-CM | POA: Diagnosis not present

## 2020-03-02 DIAGNOSIS — M25551 Pain in right hip: Secondary | ICD-10-CM

## 2020-03-02 DIAGNOSIS — S7001XA Contusion of right hip, initial encounter: Secondary | ICD-10-CM | POA: Diagnosis not present

## 2020-03-02 DIAGNOSIS — Z4802 Encounter for removal of sutures: Secondary | ICD-10-CM

## 2020-03-02 MED ORDER — LIDOCAINE-PRILOCAINE 2.5-2.5 % EX CREA
TOPICAL_CREAM | Freq: Once | CUTANEOUS | Status: AC
  Administered 2020-03-02: 1 via TOPICAL
  Filled 2020-03-02: qty 5

## 2020-03-02 NOTE — Discharge Instructions (Addendum)
Have patient follow-up with her primary care provider if any continued problems.  Keep her left thumb clean and dry.  Watch for any signs of infection.  If there is physical therapy available have them work with her and her walker ambulate.

## 2020-03-02 NOTE — ED Triage Notes (Signed)
Pt in via EMS from [facility]. EMS reports pt fell a couple of weeks ago and had to get stitches. Per facility the MD was trying to take the stitches out today but the patient said it was too painful so they were not able to get them out. Pt sent for stitch removal and evaluation of leg. No recent falls or injuries.

## 2020-03-02 NOTE — TOC Transition Note (Signed)
Transition of Care [facility]) - CM/SW Discharge Note   Patient Details  Name: Alyssa Holloway MRN: [id number] Date of Birth: [birth year]  Transition of [facility]) CM/SW Contact:  Anselm Pancoast, RN Phone Number: 03/02/2020, 4:47 PM   Clinical Narrative:    Received call from [facility] states daughter is trying to speak with nurse or doctor in the emergency department. RN CM requested ED RN contact patient-RN agreed to outreach to family.          Patient Goals and CMS Choice        Discharge Placement                       Discharge Plan and Services                                     Social Determinants of Health (SDOH) Interventions     Readmission Risk Interventions No flowsheet data found.

## 2020-03-02 NOTE — ED Notes (Signed)
Contacted [facility] and requested transportation. Facility unsure if transportation still running, waiting for call back at this time.

## 2020-03-02 NOTE — ED Notes (Signed)
Pt brief soiled. This RN cleaned pt up and placed a Purewick, bed linen changed as well. Pt tolerated well. Sandwich and beverage provided to pt. No further needs expressed by pt.

## 2020-03-02 NOTE — ED Triage Notes (Signed)
Pt reports stitches were in her left hand base of thumb and they were not able to be removed and she was told someone here would take them out. Pt also reports some pain to right hip, denies recent injuries or falls.

## 2020-03-02 NOTE — ED Notes (Signed)
Pt changed by this RN. Patient had another bowel movement, brief clean/dry at this time.

## 2020-03-02 NOTE — ED Notes (Signed)
Called [facility] for transport to [facility]

## 2020-03-02 NOTE — ED Notes (Signed)
Pt had bowel movement and was changed by this RN. Patient brief clean and dry at this time.

## 2020-03-02 NOTE — TOC Initial Note (Addendum)
Transition of Care [facility]) - Initial/Assessment Note    Patient Details  Name: Alyssa Holloway MRN: [id number] Date of Birth: November 11, 1920  [facility] CM/SW Contact:    Anselm Pancoast, RN Phone Number: 03/02/2020, 2:40 PM  Clinical Narrative:                 Damaris Schooner to Lattie Haw @ [facility] states patient was sent over due to increased pain in her back. Patient is typically up and dancing well groomed and energetic however recently was only sitting in wheelchair and crying due to increased back/leg pain. Patient will need to transport back to [facility] via EMS if after 5pm.         Patient Goals and CMS Choice        Expected Discharge Plan and Services                                                Prior Living Arrangements/Services                       Activities of Daily Living      Permission Sought/Granted                  Emotional Assessment              Admission diagnosis:  Leg Pain Patient Active Problem List   Diagnosis Date Noted  . Trimalleolar fracture of ankle, closed, right, initial encounter 08/18/2018  . Bradycardia 08/13/2018  . Internal hemorrhoid 05/11/2014   PCP:  [facility]:   [place], [place] - [address] [place] [address] Phone: [phone] Fax: [phone]     Social Determinants of Health (SDOH) Interventions    Readmission Risk Interventions No flowsheet data found.

## 2020-03-02 NOTE — ED Provider Notes (Signed)
[facility] Emergency Department Provider Note   ____________________________________________   First MD Initiated Contact with Patient 03/02/20 1107     (approximate)  I have reviewed the triage vital signs and the nursing notes.   HISTORY  Chief Complaint Suture / Staple Removal and Hip Pain   HPI Alyssa Holloway is a 86 y.o. female is brought via EMS from [facility] for suture removal at the base of her left thumb and also for evaluation of her right hip pain.  Patient denies any injury or falls.  Patient states that she was told that someone here would take her stitches out.  She was told to return in 7 to 10 days per her discharge instructions however is more closer to 3 weeks since sutures were placed.       Past Medical History:  Diagnosis Date  . Anxiety   . Dementia ([place])    ALZHEIMERS  . Diabetes mellitus without complication (HCC)    DIET CONTROLLED  . Fibrocystic breast disease   . Hemorrhoid   . Hyperlipidemia   . Hypertension   . Hypothyroidism   . Malignant neoplasm of upper-outer quadrant of female breast ([place]) August 25, 2003   Invasive ductal carcinoma, T2, N0.   ER/PR negative, HER-2/neu 3+.  . Rectal bleeding   . Thrombocytopenia ([place])   . Thyroid disease     Patient Active Problem List   Diagnosis Date Noted  . Trimalleolar fracture of ankle, closed, right, initial encounter 08/18/2018  . Bradycardia 08/13/2018  . Internal hemorrhoid 05/11/2014    Past Surgical History:  Procedure Laterality Date  . APPENDECTOMY    . BREAST SURGERY Right    lumpectomy  . CATARACT EXTRACTION W/PHACO Left 03/21/2017   Procedure: CATARACT EXTRACTION PHACO AND INTRAOCULAR LENS PLACEMENT (IOC);  Surgeon: Leandrew Koyanagi, MD;  Location: [facility] ORS;  Service: Ophthalmology;  Laterality: Left;  [place] 1:38.9 AP% 24.4 CDE 24.10 Fluid pack lot # [id number]  . HEMORRHOID BANDING  August 2015  . ORIF ANKLE FRACTURE Right 08/18/2018   Procedure:  OPEN REDUCTION INTERNAL FIXATION (ORIF) ANKLE FRACTURE;  Surgeon: Earnestine Leys, MD;  Location: [facility] ORS;  Service: Orthopedics;  Laterality: Right;  . STAPLE HEMORRHOIDECTOMY  April 09, 2006  . THYROID SURGERY      Prior to Admission medications   Medication Sig Start Date End Date Taking? Authorizing Provider  acetaminophen (TYLENOL) 500 MG tablet Take 1,000 mg by mouth every 8 (eight) hours.    [provider]  amLODipine (NORVASC) 5 MG tablet Take 10 mg by mouth daily.     [provider]  ascorbic acid (VITAMIN C) 1000 MG tablet Take 1,000 mg by mouth daily.    [provider]  aspirin EC 325 MG tablet Take 1 tablet (325 mg total) by mouth daily. 08/20/18   Earnestine Leys, MD  benazepril (LOTENSIN) 20 MG tablet Take 20 mg by mouth daily.     [provider]  cholecalciferol (VITAMIN D) 1000 UNITS tablet Take 1,000 Units by mouth daily.    [provider]  donepezil (ARICEPT) 10 MG tablet Take 10 mg by mouth at bedtime.    [provider]  furosemide (LASIX) 20 MG tablet Take 1 tablet by mouth daily.  03/22/14   [provider]  levothyroxine (SYNTHROID, LEVOTHROID) 125 MCG tablet Take 112 mcg by mouth daily.  03/22/14   [provider]  lidocaine (XYLOCAINE) 5 % ointment Apply 1 application topically 3 (three) times daily  as needed. 05/26/14   Robert Bellow, MD  Multiple Vitamin (MULTIVITAMIN) tablet Take 1 tablet by mouth daily.    [provider]  Omega-3 Fatty Acids (FISH OIL) 1000 MG CAPS Take 1,000 mg by mouth daily.     [provider]  traMADol-acetaminophen (ULTRACET) 37.5-325 MG tablet Take 1 tablet by mouth every 6 (six) hours as needed. 08/20/18   Earnestine Leys, MD    Allergies Vicodin [hydrocodone-acetaminophen]  History reviewed. No pertinent family history.  Social History Social History   Tobacco Use  . Smoking status: Never Smoker  . Smokeless tobacco: Never Used  Substance  Use Topics  . Alcohol use: Yes    Comment: GLASS OF WINE ON THURSDAYS  . Drug use: No    Review of Systems Constitutional: No fever/chills Eyes: No visual changes. Cardiovascular: Denies chest pain. Respiratory: Denies shortness of breath. Gastrointestinal: No abdominal pain.  No nausea, no vomiting.  Genitourinary: Negative for dysuria. Musculoskeletal: Positive right hip pain. Skin: Negative for rash. Neurological: Negative for headaches, focal weakness or numbness. ____________________________________________   PHYSICAL EXAM:  VITAL SIGNS: ED Triage Vitals  Enc Vitals Group     BP 03/02/20 1105 (!) 144/91     Pulse Rate 03/02/20 1105 81     Resp 03/02/20 1105 18     Temp 03/02/20 1105 98.7 F (37.1 C)     Temp Source 03/02/20 1105 Oral     SpO2 03/02/20 1105 95 %     Weight 03/02/20 1102 131 lb (59.4 kg)     Height 03/02/20 1102 _0  (1.676 m)     Head Circumference --      Peak Flow --      Pain Score 03/02/20 1102 6     Pain Loc --      Pain Edu? --      Excl. in [place]? --    Constitutional: Alert and oriented. Well appearing and in no acute distress.  Patient is cooperative during exam. Eyes: Conjunctivae are normal.  Head: Atraumatic. Nose: No congestion/rhinnorhea. Neck: No stridor.   Cardiovascular: Normal rate, regular rhythm. Grossly normal heart sounds.  Good peripheral circulation. Respiratory: Normal respiratory effort.  No retractions. Lungs CTAB. Gastrointestinal: Soft and nontender. No distention. Musculoskeletal: Tenderness on palpation of the right hip laterally but no bruising or deformity noted.  No difficulty with range of motion of the right lower extremity.  No shortening or rotation noted.  No skin discoloration.  Pulses present distally. Neurologic:  Normal speech and language. No gross focal neurologic deficits are appreciated.  Skin:  Skin is warm, dry and intact.  On examination of the left thumb no obvious sutures are seen.  Area has  healed without any signs of infection. Psychiatric: Mood and affect are normal. Speech and behavior are normal.  ____________________________________________   LABS (all labs ordered are listed, but only abnormal results are displayed)  Labs Reviewed - No data to display RADIOLOGY   Official radiology report(s): DG HIP UNILAT WITH PELVIS 2-3 VIEWS RIGHT  Result Date: 03/02/2020 CLINICAL DATA:  Right hip pain for 2 weeks. EXAM: DG HIP (WITH OR WITHOUT PELVIS) 2-3V RIGHT COMPARISON:  None. FINDINGS: There is no evidence of hip fracture or dislocation. Mild degenerative spurring of hip is noted without significant joint space narrowing. Generalized osteopenia noted. Calcified fibroids are noted in the right pelvis. IMPRESSION: 1. No acute findings. 2. Mild right hip osteoarthritis. 3. Osteopenia. 4. Calcified uterine fibroids. Electronically Signed   By: Jenny Reichmann  Tereso Newcomer M.D.   On: 03/02/2020 12:09    ____________________________________________   PROCEDURES  Procedure(s) performed (including Critical Care):  Procedures  Left thumb was swabbed with alcohol pad after EMLA cream was applied.  The 2 areas in question for sutures was unroofed.  No purulent drainage or sutures were noted.  Also Dr. Joan Mayans also reviewed the area and agreed that there was no sutures present.  Dressing was applied by RN. ____________________________________________   INITIAL IMPRESSION / ASSESSMENT AND PLAN / ED COURSE  As part of my medical decision making, I reviewed the following data within the electronic MEDICAL RECORD NUMBER Notes from prior ED visits and [place] Controlled Substance Database  86 year old female is brought to the ED via EMS for evaluation of her right hip and also to have sutures removed from her left thumb.  Right hip x-ray with pelvis was negative for any acute bone injury.  Patient does have some mild osteoarthritis along with osteopenia.  Patient is already taking tramadol at the nursing facility  so no new pain medications were ordered.  It was noted on her discharge papers for the nursing facility to see if physical therapy can work with her on ambulating better with her walker when not taking the pain medication.  Patient was sent back to [facility] via EMS as they do not have transportation for their patients.  Update on the patient was given to patient's daughter who lives in [place].  Daughter is aware that patient is going back to [place] and that there were no sutures in her left thumb.  X-rays did not show any fracture to her hip or pelvis. ____________________________________________   FINAL CLINICAL IMPRESSION(S) / ED DIAGNOSES  Final diagnoses:  Acute right hip pain  Contusion of right hip, initial encounter  Encounter for removal of sutures     ED Discharge Orders    None       Note:  This document was prepared using Dragon voice recognition software and may include unintentional dictation errors.    Johnn Hai, PA-C 03/02/20 1706    Lilia Pro., MD 03/03/20 [phone]

## 2020-04-12 ENCOUNTER — Other Ambulatory Visit: Payer: Self-pay | Admitting: Internal Medicine

## 2020-04-12 DIAGNOSIS — M545 Low back pain, unspecified: Secondary | ICD-10-CM

## 2020-04-25 ENCOUNTER — Other Ambulatory Visit: Payer: Self-pay

## 2020-04-25 ENCOUNTER — Emergency Department
Admission: EM | Admit: 2020-04-25 | Discharge: 2020-04-26 | Disposition: A | Discharge status: Skilled Nursing Facility | Payer: Medicare Other | Attending: Emergency Medicine | Admitting: Emergency Medicine

## 2020-04-25 ENCOUNTER — Emergency Department: Payer: Medicare Other

## 2020-04-25 DIAGNOSIS — E119 Type 2 diabetes mellitus without complications: Secondary | ICD-10-CM | POA: Diagnosis not present

## 2020-04-25 DIAGNOSIS — Z79899 Other long term (current) drug therapy: Secondary | ICD-10-CM | POA: Diagnosis not present

## 2020-04-25 DIAGNOSIS — R531 Weakness: Secondary | ICD-10-CM | POA: Diagnosis not present

## 2020-04-25 DIAGNOSIS — I10 Essential (primary) hypertension: Secondary | ICD-10-CM | POA: Insufficient documentation

## 2020-04-25 DIAGNOSIS — E039 Hypothyroidism, unspecified: Secondary | ICD-10-CM | POA: Insufficient documentation

## 2020-04-25 DIAGNOSIS — C50411 Malignant neoplasm of upper-outer quadrant of right female breast: Secondary | ICD-10-CM | POA: Insufficient documentation

## 2020-04-25 DIAGNOSIS — Z7982 Long term (current) use of aspirin: Secondary | ICD-10-CM | POA: Diagnosis not present

## 2020-04-25 DIAGNOSIS — R112 Nausea with vomiting, unspecified: Secondary | ICD-10-CM | POA: Diagnosis present

## 2020-04-25 DIAGNOSIS — R110 Nausea: Secondary | ICD-10-CM | POA: Diagnosis not present

## 2020-04-25 DIAGNOSIS — R197 Diarrhea, unspecified: Secondary | ICD-10-CM | POA: Insufficient documentation

## 2020-04-25 LAB — COMPREHENSIVE METABOLIC PANEL
ALT: 10 U/L (ref 0–44)
AST: 14 U/L — ABNORMAL LOW (ref 15–41)
Albumin: 4.4 g/dL (ref 3.5–5.0)
Alkaline Phosphatase: 115 U/L (ref 38–126)
Anion gap: 12 (ref 5–15)
BUN: 14 mg/dL (ref 8–23)
CO2: 28 mmol/L (ref 22–32)
Calcium: 9.3 mg/dL (ref 8.9–10.3)
Chloride: 101 mmol/L (ref 98–111)
Creatinine, Ser: 0.64 mg/dL (ref 0.44–1.00)
GFR calc Af Amer: >60 mL/min (ref >60)
GFR calc non Af Amer: >60 mL/min (ref >60)
Glucose, Bld: 136 mg/dL — ABNORMAL HIGH (ref 70–99)
Potassium: 3.9 mmol/L (ref 3.5–5.1)
Sodium: 141 mmol/L (ref 135–145)
Total Bilirubin: 0.9 mg/dL (ref 0.3–1.2)
Total Protein: 7 g/dL (ref 6.5–8.1)

## 2020-04-25 LAB — URINALYSIS, COMPLETE (UACMP) WITH MICROSCOPIC
Bacteria, UA: NONE SEEN
Bilirubin Urine: NEGATIVE
Glucose, UA: NEGATIVE mg/dL
Hgb urine dipstick: NEGATIVE
Ketones, ur: NEGATIVE mg/dL
Nitrite: NEGATIVE
Protein, ur: NEGATIVE mg/dL
Specific Gravity, Urine: 1.008 (ref 1.005–1.030)
pH: 8 (ref 5.0–8.0)

## 2020-04-25 LAB — CBC
HCT: 44.4 % (ref 36.0–46.0)
Hemoglobin: 14.7 g/dL (ref 12.0–15.0)
MCH: 33 pg (ref 26.0–34.0)
MCHC: 33.1 g/dL (ref 30.0–36.0)
MCV: 99.6 fL (ref 80.0–100.0)
Platelets: 153 10*3/uL (ref 150–400)
RBC: 4.46 MIL/uL (ref 3.87–5.11)
RDW: 12.7 % (ref 11.5–15.5)
WBC: 8 10*3/uL (ref 4.0–10.5)
nRBC: 0 % (ref 0.0–0.2)

## 2020-04-25 LAB — TROPONIN I (HIGH SENSITIVITY): Troponin I (High Sensitivity): 3 ng/L (ref <18)

## 2020-04-25 MED ORDER — SODIUM CHLORIDE 0.9 % IV BOLUS
1000.0000 mL | Freq: Once | INTRAVENOUS | Status: AC
  Administered 2020-04-25: 1000 mL via INTRAVENOUS

## 2020-04-25 MED ORDER — ONDANSETRON HCL 4 MG PO TABS: 4.0000 mg | ORAL_TABLET | Freq: Every day | ORAL | 0 refills | Status: AC | PRN

## 2020-04-25 NOTE — ED Notes (Signed)
Resumed care from bri rn.  Pt alert.  siderails up x 2.

## 2020-04-25 NOTE — ED Triage Notes (Signed)
Pt to ED via [facility] from [facility] for chief complaint N/V/D x1 hour. Pt oriented to place and person only. Denies pain,however keeps moaning and groaning.

## 2020-04-25 NOTE — ED Notes (Signed)
Pt changed and peri care provided 

## 2020-04-25 NOTE — ED Provider Notes (Signed)
[facility] Emergency Department Provider Note  Time seen: 9:05 PM  I have reviewed the triage vital signs and the nursing notes.   HISTORY  Chief Complaint Nausea and Emesis   HPI Alyssa Holloway is a 86 y.o. female with a past medical history of dementia, diabetes, hypertension, hyperlipidemia, presents to the emergency department for nausea and diarrhea as well as lightheadedness.  According to the patient approximately 1 hour prior to arrival she became nauseated had several episodes of diarrhea.  Patient states she is feeling dizzy so she called EMS.  Here the patient appears well, no acute distress.  Patient is awake and alert, she is able to tell me her name where she is but not able to tell me the year.  Patient denies any pain.   Past Medical History:  Diagnosis Date  . Anxiety   . Dementia ([place])    ALZHEIMERS  . Diabetes mellitus without complication (HCC)    DIET CONTROLLED  . Fibrocystic breast disease   . Hemorrhoid   . Hyperlipidemia   . Hypertension   . Hypothyroidism   . Malignant neoplasm of upper-outer quadrant of female breast ([place]) August 25, 2003   Invasive ductal carcinoma, T2, N0.   ER/PR negative, HER-2/neu 3+.  . Rectal bleeding   . Thrombocytopenia ([place])   . Thyroid disease     Patient Active Problem List   Diagnosis Date Noted  . Trimalleolar fracture of ankle, closed, right, initial encounter 08/18/2018  . Bradycardia 08/13/2018  . Internal hemorrhoid 05/11/2014    Past Surgical History:  Procedure Laterality Date  . APPENDECTOMY    . BREAST SURGERY Right    lumpectomy  . CATARACT EXTRACTION W/PHACO Left 03/21/2017   Procedure: CATARACT EXTRACTION PHACO AND INTRAOCULAR LENS PLACEMENT (IOC);  Surgeon: Leandrew Koyanagi, MD;  Location: [facility] ORS;  Service: Ophthalmology;  Laterality: Left;  [place] 1:38.9 AP% 24.4 CDE 24.10 Fluid pack lot # [id number]  . HEMORRHOID BANDING  August 2015  . ORIF ANKLE FRACTURE Right  08/18/2018   Procedure: OPEN REDUCTION INTERNAL FIXATION (ORIF) ANKLE FRACTURE;  Surgeon: Earnestine Leys, MD;  Location: [facility] ORS;  Service: Orthopedics;  Laterality: Right;  . STAPLE HEMORRHOIDECTOMY  April 09, 2006  . THYROID SURGERY      Prior to Admission medications   Medication Sig Start Date End Date Taking? Authorizing Provider  acetaminophen (TYLENOL) 500 MG tablet Take 1,000 mg by mouth every 8 (eight) hours.    [provider]  amLODipine (NORVASC) 5 MG tablet Take 10 mg by mouth daily.     [provider]  ascorbic acid (VITAMIN C) 1000 MG tablet Take 1,000 mg by mouth daily.    [provider]  aspirin EC 325 MG tablet Take 1 tablet (325 mg total) by mouth daily. 08/20/18   Earnestine Leys, MD  benazepril (LOTENSIN) 20 MG tablet Take 20 mg by mouth daily.     [provider]  cholecalciferol (VITAMIN D) 1000 UNITS tablet Take 1,000 Units by mouth daily.    [provider]  donepezil (ARICEPT) 10 MG tablet Take 10 mg by mouth at bedtime.    [provider]  furosemide (LASIX) 20 MG tablet Take 1 tablet by mouth daily.  03/22/14   [provider]  levothyroxine (SYNTHROID, LEVOTHROID) 125 MCG tablet Take 112 mcg by mouth daily.  03/22/14   [provider]  lidocaine (XYLOCAINE) 5 % ointment Apply 1 application topically 3 (three) times daily as needed. 05/26/14  Robert Bellow, MD  Multiple Vitamin (MULTIVITAMIN) tablet Take 1 tablet by mouth daily.    [provider]  Omega-3 Fatty Acids (FISH OIL) 1000 MG CAPS Take 1,000 mg by mouth daily.     [provider]  traMADol-acetaminophen (ULTRACET) 37.5-325 MG tablet Take 1 tablet by mouth every 6 (six) hours as needed. 08/20/18   Earnestine Leys, MD    Allergies  Allergen Reactions  . Vicodin [Hydrocodone-Acetaminophen]     No family history on file.  Social History Social History   Tobacco Use  . Smoking status: Never Smoker  . Smokeless  tobacco: Never Used  Substance Use Topics  . Alcohol use: Yes    Comment: GLASS OF WINE ON THURSDAYS  . Drug use: No    Review of Systems Constitutional: Negative for fever. Cardiovascular: Negative for chest pain. Respiratory: Negative for shortness of breath.  Negative for cough. Gastrointestinal: Negative for abdominal pain.  Positive for nausea but negative for vomiting.  Positive diarrhea. Genitourinary: Negative for urinary compaints Musculoskeletal: Negative for musculoskeletal complaints Neurological: Negative for headache All other ROS negative  ____________________________________________   PHYSICAL EXAM:  VITAL SIGNS: ED Triage Vitals  Enc Vitals Group     BP 04/25/20 2104 (!) 156/90     Pulse Rate 04/25/20 2104 83     Resp 04/25/20 2104 (!) 23     Temp 04/25/20 2104 97.9 F (36.6 C)     Temp Source 04/25/20 2104 Oral     SpO2 04/25/20 2104 97 %     Weight 04/25/20 2105 165 lb (74.8 kg)     Height 04/25/20 2105 '5\' 7"'$  (1.702 m)     Head Circumference --      Peak Flow --      Pain Score 04/25/20 2105 0     Pain Loc --      Pain Edu? --      Excl. in [place]? --    Constitutional: Alert and oriented. Well appearing and in no distress. Eyes: Normal exam ENT      Head: Normocephalic and atraumatic.      Mouth/Throat: Mucous membranes are moist. Cardiovascular: Normal rate, regular rhythm. Respiratory: Normal respiratory effort without tachypnea nor retractions. Breath sounds are clear  Gastrointestinal: Soft and nontender. No distention.  Musculoskeletal: Nontender with normal range of motion in all extremities. No lower extremity tenderness or edema. Neurologic:  Normal speech and language. No gross focal neurologic deficits  Skin:  Skin is warm, dry and intact.  Psychiatric: Mood and affect are normal.   ____________________________________________    EKG  EKG viewed and interpreted by myself shows what appears to be a sinus rhythm at 89 bpm with a  widened QRS, likely normal intervals with nonspecific ST changes.  ____________________________________________    RADIOLOGY  IMPRESSION:  No acute cardiopulmonary disease.   ____________________________________________   INITIAL IMPRESSION / ASSESSMENT AND PLAN / ED COURSE  Pertinent labs & imaging results that were available during my care of the patient were reviewed by me and considered in my medical decision making (see chart for details).   Patient presents to the emergency department for weakness nausea and diarrhea for approximately 1 hour prior to arrival per patient.  Currently the patient appears well, nontender abdomen.  Denies any chest pain.  Patient states she feels "shaky."  We will check labs, EKG, chest x-ray, IV hydrate and continue to closely monitor.  Patient agreeable to plan of care.  Labs are reassuring.  Patient  appears well.  Urinalysis is negative.  As the patient's labs are reassuring and the patient appears well I do believe she is safe for discharge home at this time.  I discussed having the patient follow-up with her PCP.  Patient agreeable.  Alyssa Holloway was evaluated in Emergency Department on 04/25/2020 for the symptoms described in the history of present illness. She was evaluated in the context of the global COVID-19 pandemic, which necessitated consideration that the patient might be at risk for infection with the SARS-CoV-2 virus that causes COVID-19. Institutional protocols and algorithms that pertain to the evaluation of patients at risk for COVID-19 are in a state of rapid change based on information released by regulatory bodies including the CDC and federal and state organizations. These policies and algorithms were followed during the patient's care in the ED.  ____________________________________________   FINAL CLINICAL IMPRESSION(S) / ED DIAGNOSES  Nausea Diarrhea Weakness   Harvest Dark, MD 04/25/20 2335

## 2020-05-01 ENCOUNTER — Emergency Department
Admission: EM | Admit: 2020-05-01 | Discharge: 2020-05-01 | Disposition: A | Discharge status: Home / Self Care | Payer: Medicare Other | Attending: Emergency Medicine | Admitting: Emergency Medicine

## 2020-05-01 ENCOUNTER — Other Ambulatory Visit: Payer: Self-pay

## 2020-05-01 DIAGNOSIS — K4090 Unilateral inguinal hernia, without obstruction or gangrene, not specified as recurrent: Secondary | ICD-10-CM | POA: Diagnosis present

## 2020-05-01 DIAGNOSIS — Z79899 Other long term (current) drug therapy: Secondary | ICD-10-CM | POA: Diagnosis not present

## 2020-05-01 DIAGNOSIS — E119 Type 2 diabetes mellitus without complications: Secondary | ICD-10-CM | POA: Insufficient documentation

## 2020-05-01 DIAGNOSIS — E039 Hypothyroidism, unspecified: Secondary | ICD-10-CM | POA: Insufficient documentation

## 2020-05-01 DIAGNOSIS — I10 Essential (primary) hypertension: Secondary | ICD-10-CM | POA: Insufficient documentation

## 2020-05-01 DIAGNOSIS — Z7982 Long term (current) use of aspirin: Secondary | ICD-10-CM | POA: Diagnosis not present

## 2020-05-01 DIAGNOSIS — F0390 Unspecified dementia without behavioral disturbance: Secondary | ICD-10-CM | POA: Diagnosis not present

## 2020-05-01 DIAGNOSIS — Z853 Personal history of malignant neoplasm of breast: Secondary | ICD-10-CM | POA: Insufficient documentation

## 2020-05-01 NOTE — ED Notes (Addendum)
Called pt's daughter Alyssa Holloway and gave update, daughter is unable to pick up pt. Attempted to call [facility] at number given by daughter [phone]) without answer. Will arrange EMS ride back per daughter/pt permission.

## 2020-05-01 NOTE — ED Provider Notes (Signed)
[facility] Emergency Department Provider Note   ____________________________________________   First MD Initiated Contact with Patient 05/01/20 1121     (approximate)  I have reviewed the triage vital signs and the nursing notes.   HISTORY  Chief Complaint Hernia    HPI Alyssa Holloway is a 86 y.o. female who was sent from primary care office with an ultrasound report saying that there was "a right inguinal hernia which appears to contain bowel lymph nodes are also identified."  Patient is not currently in any pain.  She does complain of discomfort when she walks and says is been going on for about 2 weeks.  She says she has to hold herself there.         Past Medical History:  Diagnosis Date  . Anxiety   . Dementia ([place])    ALZHEIMERS  . Diabetes mellitus without complication (HCC)    DIET CONTROLLED  . Fibrocystic breast disease   . Hemorrhoid   . Hyperlipidemia   . Hypertension   . Hypothyroidism   . Malignant neoplasm of upper-outer quadrant of female breast ([place]) August 25, 2003   Invasive ductal carcinoma, T2, N0.   ER/PR negative, HER-2/neu 3+.  . Rectal bleeding   . Thrombocytopenia ([place])   . Thyroid disease     Patient Active Problem List   Diagnosis Date Noted  . Trimalleolar fracture of ankle, closed, right, initial encounter 08/18/2018  . Bradycardia 08/13/2018  . Internal hemorrhoid 05/11/2014    Past Surgical History:  Procedure Laterality Date  . APPENDECTOMY    . BREAST SURGERY Right    lumpectomy  . CATARACT EXTRACTION W/PHACO Left 03/21/2017   Procedure: CATARACT EXTRACTION PHACO AND INTRAOCULAR LENS PLACEMENT (IOC);  Surgeon: Leandrew Koyanagi, MD;  Location: [facility] ORS;  Service: Ophthalmology;  Laterality: Left;  [place] 1:38.9 AP% 24.4 CDE 24.10 Fluid pack lot # [id number]  . HEMORRHOID BANDING  August 2015  . ORIF ANKLE FRACTURE Right 08/18/2018   Procedure: OPEN REDUCTION INTERNAL FIXATION (ORIF) ANKLE FRACTURE;   Surgeon: Earnestine Leys, MD;  Location: [facility] ORS;  Service: Orthopedics;  Laterality: Right;  . STAPLE HEMORRHOIDECTOMY  April 09, 2006  . THYROID SURGERY      Prior to Admission medications   Medication Sig Start Date End Date Taking? Authorizing Provider  acetaminophen (TYLENOL) 500 MG tablet Take 1,000 mg by mouth every 8 (eight) hours.    [provider]  amLODipine (NORVASC) 5 MG tablet Take 10 mg by mouth daily.     [provider]  ascorbic acid (VITAMIN C) 1000 MG tablet Take 1,000 mg by mouth daily.    [provider]  aspirin EC 325 MG tablet Take 1 tablet (325 mg total) by mouth daily. 08/20/18   Earnestine Leys, MD  benazepril (LOTENSIN) 20 MG tablet Take 20 mg by mouth daily.     [provider]  cholecalciferol (VITAMIN D) 1000 UNITS tablet Take 1,000 Units by mouth daily.    [provider]  donepezil (ARICEPT) 10 MG tablet Take 10 mg by mouth at bedtime.    [provider]  furosemide (LASIX) 20 MG tablet Take 1 tablet by mouth daily.  03/22/14   [provider]  levothyroxine (SYNTHROID, LEVOTHROID) 125 MCG tablet Take 112 mcg by mouth daily.  03/22/14   [provider]  lidocaine (XYLOCAINE) 5 % ointment Apply 1 application topically 3 (three) times daily as needed. 05/26/14   Robert Bellow, MD  Multiple  Vitamin (MULTIVITAMIN) tablet Take 1 tablet by mouth daily.    [provider]  Omega-3 Fatty Acids (FISH OIL) 1000 MG CAPS Take 1,000 mg by mouth daily.     [provider]  ondansetron (ZOFRAN) 4 MG tablet Take 1 tablet (4 mg total) by mouth daily as needed for nausea or vomiting. 04/25/20   Harvest Dark, MD  traMADol-acetaminophen (ULTRACET) 37.5-325 MG tablet Take 1 tablet by mouth every 6 (six) hours as needed. 08/20/18   Earnestine Leys, MD    Allergies Vicodin [hydrocodone-acetaminophen]  History reviewed. No pertinent family history.  Social History Social History    Tobacco Use  . Smoking status: Never Smoker  . Smokeless tobacco: Never Used  Substance Use Topics  . Alcohol use: Yes    Comment: GLASS OF WINE ON THURSDAYS  . Drug use: No    Review of Systems  Constitutional: No fever/chills Eyes: No visual changes. ENT: No sore throat. Cardiovascular: Denies chest pain. Respiratory: Denies shortness of breath. Gastrointestinal: Currently no abdominal pain.  No nausea, no vomiting.  No diarrhea.  No constipation. Genitourinary: Negative for dysuria. Musculoskeletal: Negative for back pain. Skin: Negative for rash. Neurological: Negative for headaches, focal weakness   ____________________________________________   PHYSICAL EXAM:  VITAL SIGNS: ED Triage Vitals  Enc Vitals Group     BP      Pulse      Resp      Temp      Temp src      SpO2      Weight      Height      Head Circumference      Peak Flow      Pain Score      Pain Loc      Pain Edu?      Excl. in [place]?     Constitutional: Alert and oriented. Well appearing and in no acute distress. Eyes: Conjunctivae are normal.  Head: Atraumatic. Nose: No congestion/rhinnorhea. Mouth/Throat: Mucous membranes are moist.  Oropharynx non-erythematous. Neck: No stridor. Cardiovascular:   Good peripheral circulation. Respiratory: No respiratory distress.  No retractions.  No increased work of breathing. Gastrointestinal: Soft and nontender. No distention. No abdominal bruits.  There is a ventral hernia present when she strains.  I cannot find any inguinal hernia when she strains or coughs. Musculoskeletal: No lower extremity tenderness nor edema.   Neurologic:  Normal speech and language. No gross focal neurologic deficits are appreciated.  Skin:  Skin is warm, dry and intact. No rash noted.   ____________________________________________   LABS (all labs ordered are listed, but only abnormal results are displayed)  Labs Reviewed - No data to  display ____________________________________________  EKG   ____________________________________________  RADIOLOGY  ED MD interpretation: Radiology report read the films are not available to me.  Official radiology report(s): No results found.  ____________________________________________   PROCEDURES  Procedure(s) performed (including Critical Care):  Procedures   ____________________________________________   INITIAL IMPRESSION / ASSESSMENT AND PLAN / ED COURSE     Discussed the patient with Dr. Christian Mate surgery.  He will follow her up in the office.  She will return if she is worse.          ____________________________________________   FINAL CLINICAL IMPRESSION(S) / ED DIAGNOSES  Final diagnoses:  Unilateral inguinal hernia without obstruction or gangrene, recurrence not specified     ED Discharge Orders    None       Note:  This document was prepared  using Systems analyst and may include unintentional dictation errors.    Nena Polio, MD 05/01/20 [phone]

## 2020-05-01 NOTE — Discharge Instructions (Signed)
Please follow-up with Dr. Christian Mate the surgeon.  He can evaluate you and see if surgery would help.  Please return if the hernia gets stuck out, changes color or gets very painful.  Also return if you have any trouble with fever or vomiting.  You can also try to follow-up with the pharmacy.  They might be able to get you a truss.  This is usually what men use to temporize things when they have an inguinal hernia but it should help you as well.

## 2020-05-01 NOTE — ED Triage Notes (Signed)
Per EMS, pt was sent by MD for hernia that was "wrapped around intestine" on scan for probably surgery. Pt c/o right roin pain w/ ambulation- states it's been several weeks since she noticed s/s.  Pt is AOX4, ambulatory with walker, NAD noted. Pt denies N/V/D.

## 2020-05-01 NOTE — ED Notes (Signed)
Report given to Victoria, RN.

## 2020-05-01 NOTE — ED Notes (Signed)
Called [facility] to request ride for patient. Nanine Means states they do not have transport and will call daughter to get patient. Number provided for call- back.

## 2020-05-02 ENCOUNTER — Ambulatory Visit: Admission: RE | Admit: 2020-05-02 | Payer: Medicare Other | Source: Ambulatory Visit

## 2020-05-06 ENCOUNTER — Ambulatory Visit: Admission: RE | Admit: 2020-05-06 | Payer: Medicare Other | Source: Ambulatory Visit

## 2020-05-18 ENCOUNTER — Other Ambulatory Visit: Payer: Self-pay | Admitting: General Surgery

## 2020-05-18 DIAGNOSIS — R1031 Right lower quadrant pain: Secondary | ICD-10-CM

## 2020-05-30 ENCOUNTER — Ambulatory Visit
Admission: RE | Admit: 2020-05-30 | Discharge: 2020-05-30 | Disposition: A | Discharge status: Home / Self Care | Payer: Medicare Other | Source: Ambulatory Visit | Attending: General Surgery | Admitting: General Surgery

## 2020-05-30 DIAGNOSIS — R1031 Right lower quadrant pain: Secondary | ICD-10-CM

## 2021-08-16 IMAGING — CT CT HEAD W/O CM
3 series · 15 of 47 positions shown, 18 images · non-contrast
Comparison: None.

CLINICAL DATA: Un witnessed fall today

EXAM:
CT HEAD WITHOUT CONTRAST
CT CERVICAL SPINE WITHOUT CONTRAST
TECHNIQUE: Multidetector CT imaging of the head and cervical spine was
performed following the standard protocol without intravenous
contrast. Multiplanar CT image reconstructions of the cervical spine
were also generated.

[Series 3: head wo · axial · 0.43mm/px · z∈[-167,-37]mm · 9 of 32 slices shown, 12 images]
[im 3/32  brain]
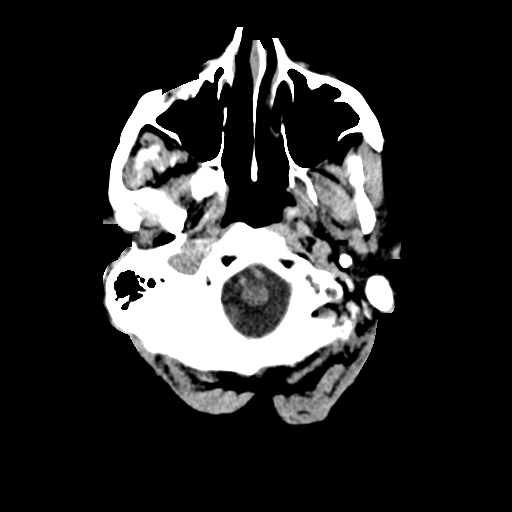
[im 3/32  bone]
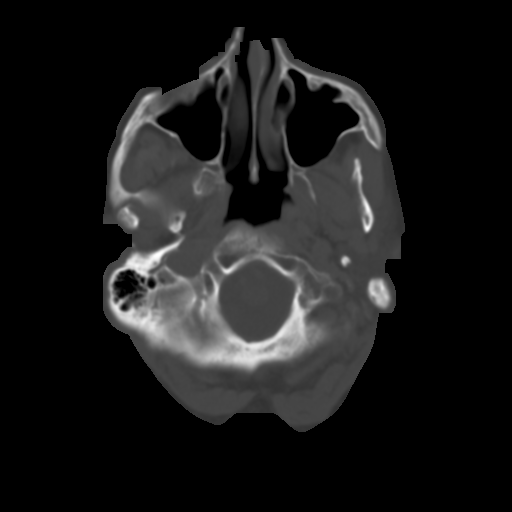
[im 6/32  brain]
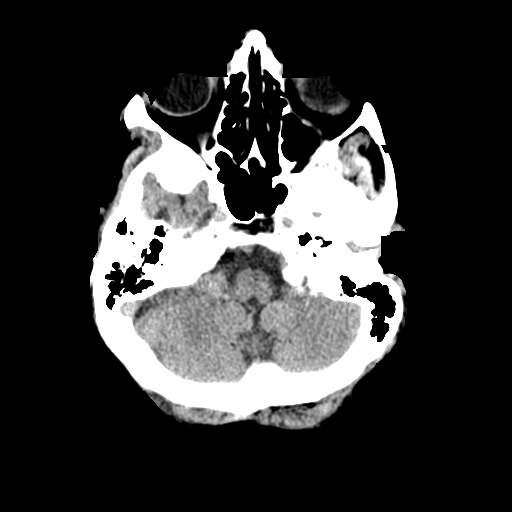
[im 9/32  brain]
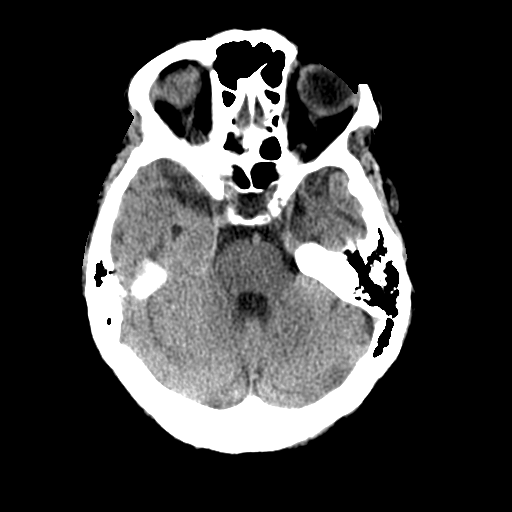
[im 12/32  brain]
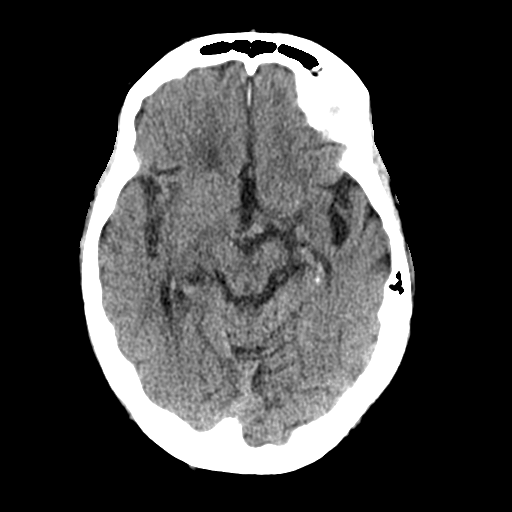
[im 17/32  brain]
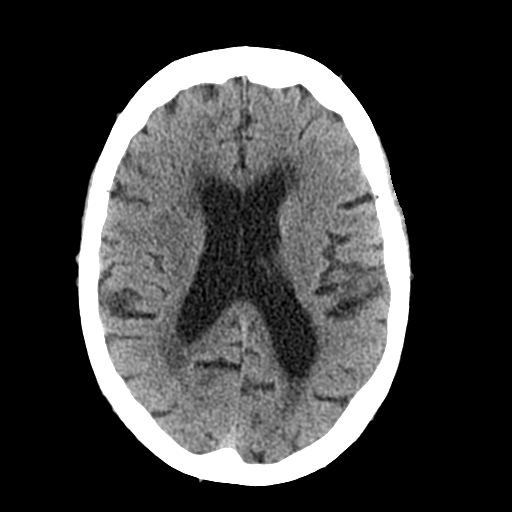
[im 17/32  bone]
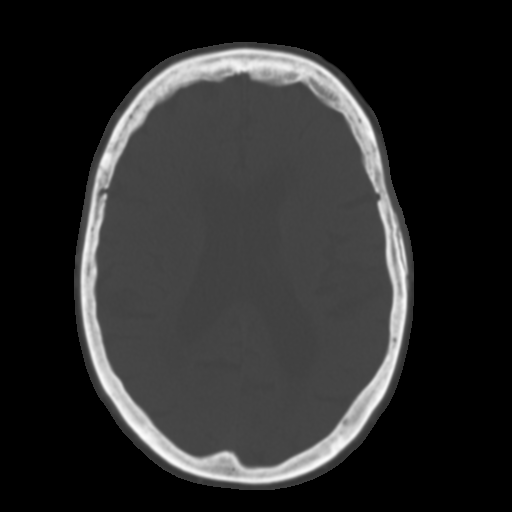
[im 20/32  brain]
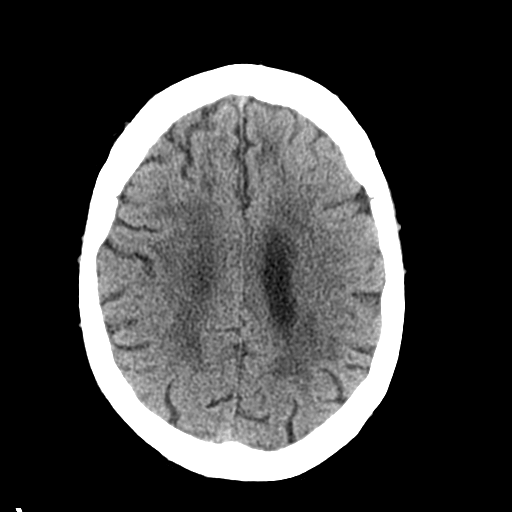
[im 23/32  brain]
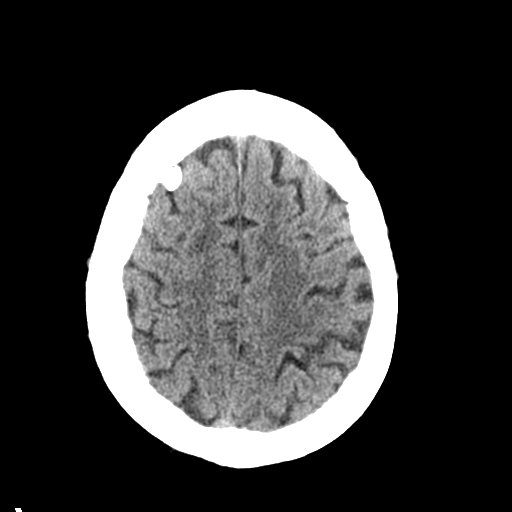
[im 26/32  brain]
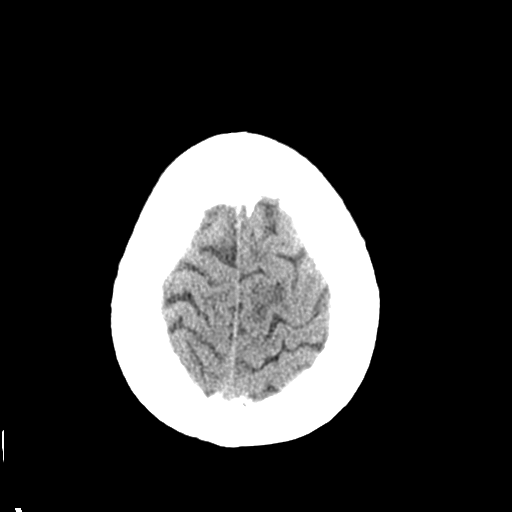
[im 29/32  brain]
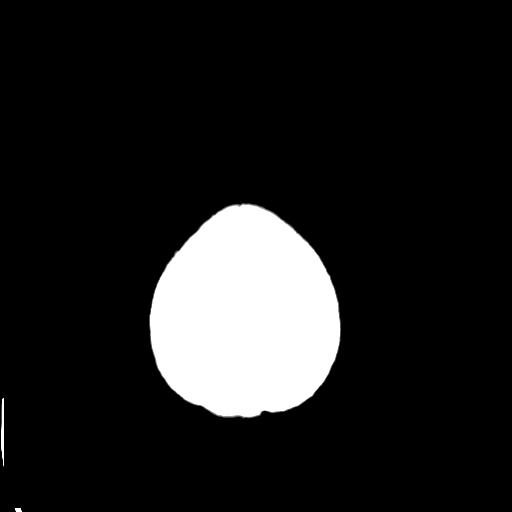
[im 29/32  bone]
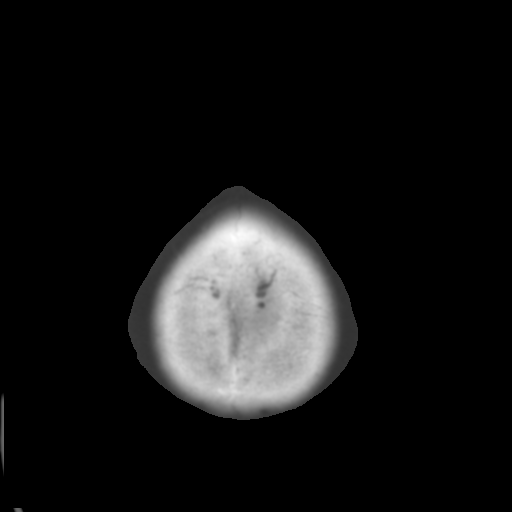

[Series 4: coronal soft tissue · coronal · 0.32mm/px · 3 of 64 slices shown]
[im 22/64  brain]
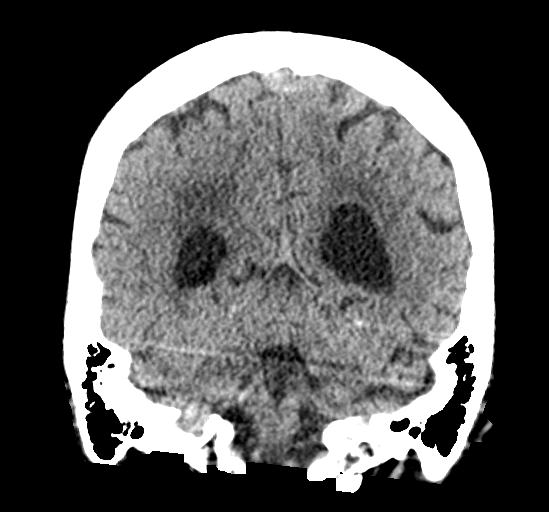
[im 29/64  brain]
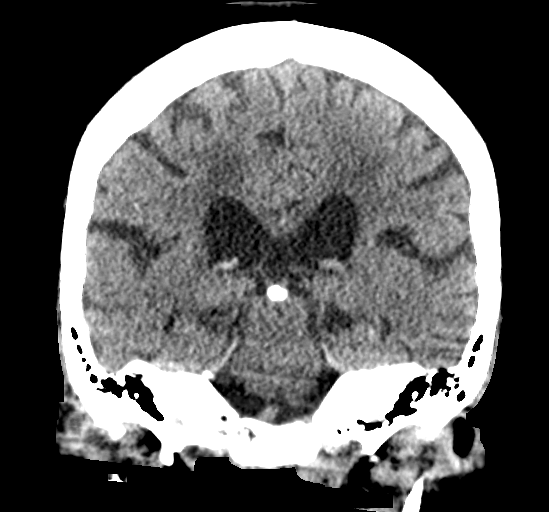
[im 36/64  brain]
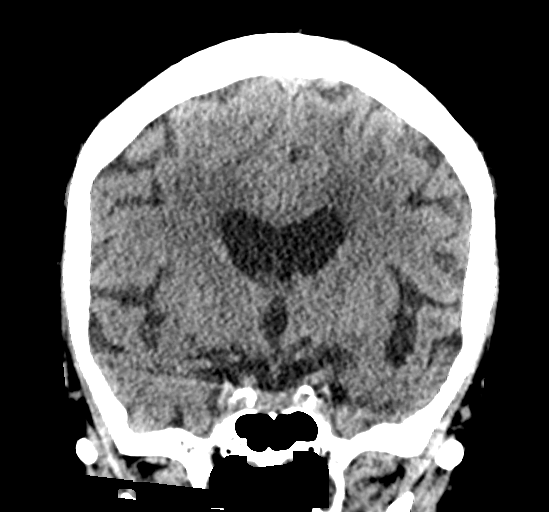

[Series 5: sagittal soft tissue · sagittal · 0.33mm/px · 3 of 51 slices shown]
[im 17/51  brain]
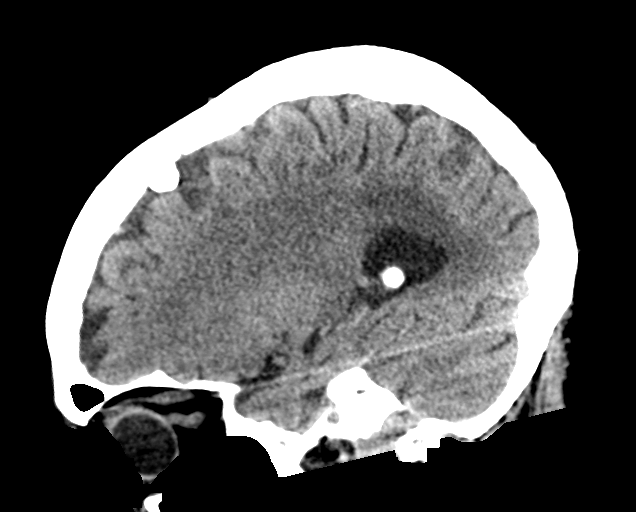
[im 26/51  brain]
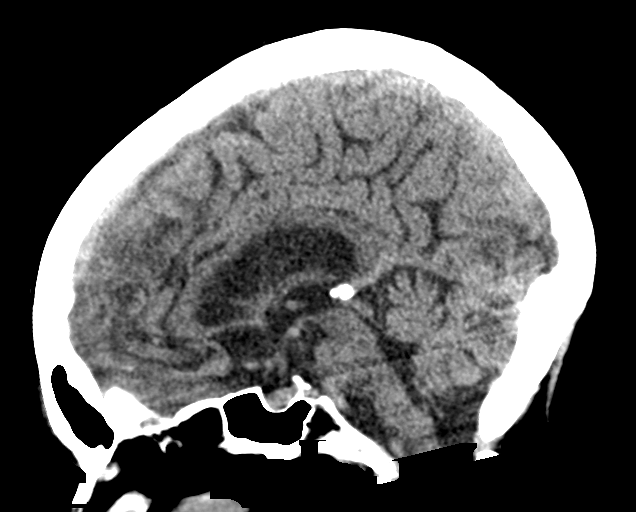
[im 34/51  brain]
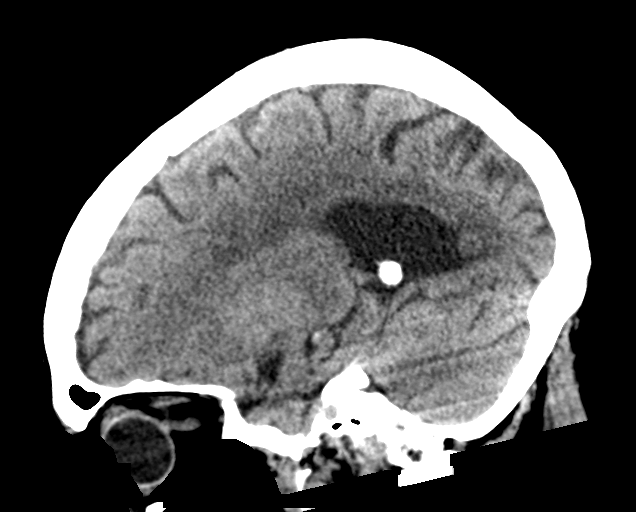

[15 of 47 positions shown; findings below may reference images not displayed]

FINDINGS: CT HEAD FINDINGS

Brain: Hypodensities throughout the periventricular white matter and
right basal ganglia consistent with age-indeterminate small vessel
ischemic changes, favor chronic. No other signs of acute infarct or
hemorrhage. Lateral ventricles and remaining midline structures are
unremarkable. No acute extra-axial fluid collections. No mass
effect.

Vascular: No hyperdense vessel or unexpected calcification.

Skull: Normal. Negative for fracture or focal lesion.

Sinuses/Orbits: No acute finding.

Other: None

CT CERVICAL SPINE FINDINGS

Alignment: Alignment is anatomic.

Skull base and vertebrae: No acute cervical spine fracture.

Soft tissues and spinal canal: No prevertebral fluid or swelling. No
visible canal hematoma.

Disc levels: There is prominent disc space narrowing and osteophyte
formation at C5/C6 and C6/C7. Diffuse facet hypertrophy greatest at
C3/C4.

There is right predominant neural foraminal narrowing at C3/C4, with
symmetrical neural foraminal encroachment at C5/C6.

Upper chest: Airway is patent. Lung apices are clear.

Other: Reconstructed images demonstrate no additional findings.
IMPRESSION: 1. Age-indeterminate small vessel ischemic changes throughout the
white matter and right basal ganglia, favor chronic.
2. No acute intracranial trauma.
3. No acute cervical spine fracture.
4. Multilevel degenerative disc disease and facet hypertrophy of the
cervical spine, greatest at C5/C6 and C6/C7. There is right
predominant neural foraminal narrowing at C3/C4, with symmetrical
neural foraminal encroachment at C5/C6.

## 2023-01-16 DEATH — deceased
